# Patient Record
Sex: Male | Born: 1935
Health system: Southern US, Community
[De-identification: ages and names within clinical notes are randomized; demographics above are authoritative.]

## PROBLEM LIST (undated history)

## (undated) DIAGNOSIS — C4490 Unspecified malignant neoplasm of skin, unspecified: Secondary | ICD-10-CM

## (undated) DIAGNOSIS — M1711 Unilateral primary osteoarthritis, right knee: Secondary | ICD-10-CM

## (undated) DIAGNOSIS — C679 Malignant neoplasm of bladder, unspecified: Secondary | ICD-10-CM

## (undated) DIAGNOSIS — Z8546 Personal history of malignant neoplasm of prostate: Secondary | ICD-10-CM

## (undated) DIAGNOSIS — I2510 Atherosclerotic heart disease of native coronary artery without angina pectoris: Secondary | ICD-10-CM

## (undated) DIAGNOSIS — M12812 Other specific arthropathies, not elsewhere classified, left shoulder: Secondary | ICD-10-CM

## (undated) DIAGNOSIS — M75102 Unspecified rotator cuff tear or rupture of left shoulder, not specified as traumatic: Secondary | ICD-10-CM

## (undated) DIAGNOSIS — Z8619 Personal history of other infectious and parasitic diseases: Secondary | ICD-10-CM

## (undated) DIAGNOSIS — E785 Hyperlipidemia, unspecified: Secondary | ICD-10-CM

## (undated) DIAGNOSIS — A0472 Enterocolitis due to Clostridium difficile, not specified as recurrent: Secondary | ICD-10-CM

## (undated) DIAGNOSIS — J181 Lobar pneumonia, unspecified organism: Secondary | ICD-10-CM

## (undated) DIAGNOSIS — I10 Essential (primary) hypertension: Secondary | ICD-10-CM

## (undated) HISTORY — DX: Atherosclerotic heart disease of native coronary artery without angina pectoris: I25.10

## (undated) HISTORY — DX: Other specific arthropathies, not elsewhere classified, left shoulder: M12.812

## (undated) HISTORY — PX: OTHER SURGICAL HISTORY: SHX169

## (undated) HISTORY — DX: Enterocolitis due to Clostridium difficile, not specified as recurrent: A04.72

## (undated) HISTORY — DX: Lobar pneumonia, unspecified organism: J18.1

## (undated) HISTORY — DX: Unilateral primary osteoarthritis, right knee: M17.11

## (undated) HISTORY — DX: Personal history of malignant neoplasm of prostate: Z85.46

## (undated) HISTORY — DX: Personal history of other infectious and parasitic diseases: Z86.19

## (undated) HISTORY — DX: Malignant neoplasm of bladder, unspecified: C67.9

## (undated) HISTORY — DX: Unspecified malignant neoplasm of skin, unspecified: C44.90

## (undated) HISTORY — PX: NASAL SEPTUM SURGERY: SHX37

## (undated) HISTORY — DX: Essential (primary) hypertension: I10

## (undated) HISTORY — DX: Hyperlipidemia, unspecified: E78.5

## (undated) HISTORY — DX: Unspecified rotator cuff tear or rupture of left shoulder, not specified as traumatic: M75.102

---
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
## 1990-01-28 HISTORY — PX: PROSTATECTOMY: SHX69

## 1994-01-28 HISTORY — PX: UVULOPALATOPHARYNGOPLASTY: SHX827

## 1995-01-29 HISTORY — PX: OTHER SURGICAL HISTORY: SHX169

## 1996-01-29 HISTORY — PX: SHOULDER ARTHROSCOPY WITH SUBACROMIAL DECOMPRESSION, ROTATOR CUFF REPAIR AND BICEP TENDON REPAIR: SHX5687

## 1997-01-28 HISTORY — PX: OTHER SURGICAL HISTORY: SHX169

## 2002-01-28 HISTORY — PX: OTHER SURGICAL HISTORY: SHX169

## 2002-01-28 HISTORY — PX: RADICAL ORCHIECTOMY: SHX2285

## 2004-01-29 HISTORY — PX: OTHER SURGICAL HISTORY: SHX169

## 2006-01-28 HISTORY — PX: LUMBAR LAMINECTOMY/ DECOMPRESSION WITH MET-RX: SHX5959

## 2007-01-29 HISTORY — PX: HERNIA REPAIR: SHX51

## 2010-01-12 HISTORY — PX: MAXIMUM ACCESS (MAS)POSTERIOR LUMBAR INTERBODY FUSION (PLIF) 1 LEVEL: SHX6368

## 2011-06-13 HISTORY — PX: CATARACT EXTRACTION: SUR2

## 2011-08-15 HISTORY — PX: CATARACT EXTRACTION: SUR2

## 2012-05-03 HISTORY — PX: APPENDECTOMY: SHX54

## 2013-01-29 ENCOUNTER — Encounter: Payer: Self-pay | Admitting: Sports Medicine

## 2013-01-29 ENCOUNTER — Ambulatory Visit (INDEPENDENT_AMBULATORY_CARE_PROVIDER_SITE_OTHER): Payer: Medicare HMO | Admitting: Sports Medicine

## 2013-01-29 VITALS — BP 132/74 | HR 67 | Wt 162.0 lb

## 2013-01-29 DIAGNOSIS — I10 Essential (primary) hypertension: Secondary | ICD-10-CM | POA: Insufficient documentation

## 2013-01-29 DIAGNOSIS — I2510 Atherosclerotic heart disease of native coronary artery without angina pectoris: Secondary | ICD-10-CM

## 2013-01-29 DIAGNOSIS — Z8546 Personal history of malignant neoplasm of prostate: Secondary | ICD-10-CM

## 2013-01-29 DIAGNOSIS — E785 Hyperlipidemia, unspecified: Secondary | ICD-10-CM

## 2013-01-29 DIAGNOSIS — I890 Lymphedema, not elsewhere classified: Secondary | ICD-10-CM | POA: Insufficient documentation

## 2013-01-29 DIAGNOSIS — Z0000 Encounter for general adult medical examination without abnormal findings: Secondary | ICD-10-CM | POA: Insufficient documentation

## 2013-01-29 DIAGNOSIS — Z299 Encounter for prophylactic measures, unspecified: Secondary | ICD-10-CM

## 2013-01-29 HISTORY — DX: Essential (primary) hypertension: I10

## 2013-01-29 HISTORY — DX: Atherosclerotic heart disease of native coronary artery without angina pectoris: I25.10

## 2013-01-29 HISTORY — DX: Personal history of malignant neoplasm of prostate: Z85.46

## 2013-01-29 HISTORY — DX: Hyperlipidemia, unspecified: E78.5

## 2013-01-29 NOTE — Assessment & Plan Note (Signed)
Stable on simvastatin, not due yet for a recheck.

## 2013-01-29 NOTE — Progress Notes (Signed)
  Subjective:    CC: Establish care.   HPI:  Coronary disease: Stable on current medications, currently sees cardiologist, no chest pain.  Lymphedema: Present in the right leg after radical prostatectomy, he wears a compression stocking and is happy with results so for.  Hyperlipidemia: Stable on statin.  Hypertension: Stable on current medications.  Past medical history, Surgical history, Family history not pertinant except as noted below, Social history, Allergies, and medications have been entered into the medical record, reviewed, and no changes needed.   Review of Systems: No headache, visual changes, nausea, vomiting, diarrhea, constipation, dizziness, abdominal pain, skin rash, fevers, chills, night sweats, swollen lymph nodes, weight loss, chest pain, body aches, joint swelling, muscle aches, shortness of breath, mood changes, visual or auditory hallucinations.  Objective:    General: Well Developed, well nourished, and in no acute distress.  Neuro: Alert and oriented x3, extra-ocular muscles intact, sensation grossly intact.  HEENT: Normocephalic, atraumatic, pupils equal round reactive to light, neck supple, no masses, no lymphadenopathy, thyroid nonpalpable.  Skin: Warm and dry, no rashes noted.  Cardiac: Regular rate and rhythm, no murmurs rubs or gallops. There is 1+ pitting edema in the right lower extremity with a negative Homans sign. Respiratory: Clear to auscultation bilaterally. Not using accessory muscles, speaking in full sentences.  Abdominal: Soft, nontender, nondistended, positive bowel sounds, no masses, no organomegaly.  Musculoskeletal: Shoulder, elbow, wrist, hip, knee, ankle stable, and with full range of motion.  Impression and Recommendations:    The patient was counselled, risk factors were discussed, anticipatory guidance given.

## 2013-01-29 NOTE — Assessment & Plan Note (Signed)
Patient will return for Medicare physical.

## 2013-01-29 NOTE — Assessment & Plan Note (Signed)
Well controlled, no changes 

## 2013-01-29 NOTE — Assessment & Plan Note (Signed)
Stable, well controlled on Plavix. No heart failure per history. He does have a cardiologist.

## 2013-01-29 NOTE — Assessment & Plan Note (Signed)
Stable with compression stocking.

## 2013-02-09 ENCOUNTER — Other Ambulatory Visit: Payer: Self-pay | Admitting: Sports Medicine

## 2013-02-09 ENCOUNTER — Ambulatory Visit (INDEPENDENT_AMBULATORY_CARE_PROVIDER_SITE_OTHER): Payer: Medicare HMO

## 2013-02-09 ENCOUNTER — Ambulatory Visit (INDEPENDENT_AMBULATORY_CARE_PROVIDER_SITE_OTHER): Payer: Commercial Managed Care - HMO | Admitting: Sports Medicine

## 2013-02-09 ENCOUNTER — Ambulatory Visit (HOSPITAL_BASED_OUTPATIENT_CLINIC_OR_DEPARTMENT_OTHER): Admission: RE | Admit: 2013-02-09 | Payer: Medicare HMO | Source: Ambulatory Visit

## 2013-02-09 ENCOUNTER — Encounter: Payer: Self-pay | Admitting: Sports Medicine

## 2013-02-09 VITALS — BP 139/76 | HR 70 | Wt 161.0 lb

## 2013-02-09 DIAGNOSIS — M150 Primary generalized (osteo)arthritis: Secondary | ICD-10-CM

## 2013-02-09 DIAGNOSIS — Z299 Encounter for prophylactic measures, unspecified: Secondary | ICD-10-CM

## 2013-02-09 DIAGNOSIS — M1711 Unilateral primary osteoarthritis, right knee: Secondary | ICD-10-CM

## 2013-02-09 DIAGNOSIS — I10 Essential (primary) hypertension: Secondary | ICD-10-CM

## 2013-02-09 DIAGNOSIS — M159 Polyosteoarthritis, unspecified: Secondary | ICD-10-CM

## 2013-02-09 DIAGNOSIS — I2510 Atherosclerotic heart disease of native coronary artery without angina pectoris: Secondary | ICD-10-CM

## 2013-02-09 DIAGNOSIS — Z136 Encounter for screening for cardiovascular disorders: Secondary | ICD-10-CM

## 2013-02-09 HISTORY — DX: Unilateral primary osteoarthritis, right knee: M17.11

## 2013-02-09 MED ORDER — SODIUM CHLORIDE 1 G PO TABS
1.0000 g | ORAL_TABLET | Freq: Three times a day (TID) | ORAL | Status: DC
Start: 2013-02-09 — End: 2013-02-09

## 2013-02-09 MED ORDER — SODIUM CHLORIDE 1 G PO TABS: 1.0000 g | ORAL_TABLET | Freq: Three times a day (TID) | ORAL | Status: DC

## 2013-02-09 NOTE — Assessment & Plan Note (Addendum)
Twelve-lead ECG, screening. He will continue followup with his cardiologist. History of smoking, we will get an aortic ultrasound screening.

## 2013-02-09 NOTE — Assessment & Plan Note (Signed)
Pain is predominantly the right radial carpal joint. X-rays. He will return sometime later this week for a radiocarpal joint injection.

## 2013-02-09 NOTE — Assessment & Plan Note (Signed)
Medicare physical performed today. Return in one year for his next one.

## 2013-02-09 NOTE — Progress Notes (Signed)
Subjective:    Jeffery Hart is a 80 y.o. male who presents for Medicare Annual/Subsequent preventive examination.   He also has significant right wrist pain, and in fact osteoarthritis at multiple sites. He currently takes Tylenol, but this provided only minimal relief of his pain. Predominant pain is over the dorsal radiocarpal joint. Moderate, persistent.  Preventive Screening-Counseling & Management  Tobacco History  Smoking status  . Former Smoker  Smokeless tobacco  . Not on file    Problems Prior to Visit 1.   Current Problems (verified) Patient Active Problem List   Diagnosis Date Noted  . Essential hypertension, benign 01/29/2013  . CAD (coronary artery disease) status post bypass grafting 01/29/2013  . Preventive measure 01/29/2013  . History of prostate cancer 01/29/2013  . Lymphedema of right leg 01/29/2013  . Hyperlipidemia 01/29/2013    Medications Prior to Visit Current Outpatient Prescriptions on File Prior to Visit  Medication Sig Dispense Refill  . acyclovir (ZOVIRAX) 800 MG tablet Take 800 mg by mouth every 6 (six) hours as needed.      . clopidogrel (PLAVIX) 75 MG tablet Take 75 mg by mouth daily with breakfast.      . cyclobenzaprine (FLEXERIL) 10 MG tablet Take 10 mg by mouth every 8 (eight) hours as needed for muscle spasms.      Marland Kitchen doxepin (SINEQUAN) 25 MG capsule Take 25 mg by mouth.      . ferrous sulfate 325 (65 FE) MG tablet Take 325 mg by mouth daily with breakfast.      . fluorouracil (EFUDEX) 5 % cream Apply topically 2 (two) times daily.      . furosemide (LASIX) 80 MG tablet Take 80 mg by mouth.      . gabapentin (NEURONTIN) 300 MG capsule Take 300 mg by mouth 3 (three) times daily.      Marland Kitchen HYDROcodone-acetaminophen (VICODIN) 5-500 MG per tablet Take 1 tablet by mouth every 6 (six) hours as needed for pain.      Marland Kitchen lisinopril (PRINIVIL,ZESTRIL) 10 MG tablet Take 10 mg by mouth daily.      . nitroGLYCERIN (NITROSTAT) 0.4 MG SL tablet Place  0.4 mg under the tongue as needed for chest pain.      Marland Kitchen propranolol (INDERAL) 20 MG tablet Take 30 mg by mouth 2 (two) times daily.      . simvastatin (ZOCOR) 40 MG tablet Take 40 mg by mouth daily.      . sodium chloride 1 G tablet Take 1 g by mouth 3 (three) times daily.       No current facility-administered medications on file prior to visit.    Current Medications (verified) Current Outpatient Prescriptions  Medication Sig Dispense Refill  . acyclovir (ZOVIRAX) 800 MG tablet Take 800 mg by mouth every 6 (six) hours as needed.      . clopidogrel (PLAVIX) 75 MG tablet Take 75 mg by mouth daily with breakfast.      . cyclobenzaprine (FLEXERIL) 10 MG tablet Take 10 mg by mouth every 8 (eight) hours as needed for muscle spasms.      Marland Kitchen doxepin (SINEQUAN) 25 MG capsule Take 25 mg by mouth.      . ferrous sulfate 325 (65 FE) MG tablet Take 325 mg by mouth daily with breakfast.      . fluorouracil (EFUDEX) 5 % cream Apply topically 2 (two) times daily.      . furosemide (LASIX) 80 MG tablet Take 80 mg by mouth.      Marland Kitchen  gabapentin (NEURONTIN) 300 MG capsule Take 300 mg by mouth 3 (three) times daily.      Marland Kitchen HYDROcodone-acetaminophen (VICODIN) 5-500 MG per tablet Take 1 tablet by mouth every 6 (six) hours as needed for pain.      Marland Kitchen lisinopril (PRINIVIL,ZESTRIL) 10 MG tablet Take 10 mg by mouth daily.      . nitroGLYCERIN (NITROSTAT) 0.4 MG SL tablet Place 0.4 mg under the tongue as needed for chest pain.      Marland Kitchen propranolol (INDERAL) 20 MG tablet Take 30 mg by mouth 2 (two) times daily.      . simvastatin (ZOCOR) 40 MG tablet Take 40 mg by mouth daily.      . sodium chloride 1 G tablet Take 1 g by mouth 3 (three) times daily.       No current facility-administered medications for this visit.     Allergies (verified) Review of patient's allergies indicates no known allergies.   PAST HISTORY  Family History History reviewed. No pertinent family history.  Social History History   Substance Use Topics  . Smoking status: Former Research scientist (life sciences)  . Smokeless tobacco: Not on file  . Alcohol Use: Not on file    Are there smokers in your home (other than you)?  No  Risk Factors Current exercise habits: The patient does not participate in regular exercise at present.  Dietary issues discussed: No   Cardiac risk factors: advanced age (older than 68 for men, 67 for women), dyslipidemia, hypertension and sedentary lifestyle.  Depression Screen (Note: if answer to either of the following is "Yes", a more complete depression screening is indicated)   Q1: Over the past two weeks, have you felt down, depressed or hopeless? No  Q2: Over the past two weeks, have you felt little interest or pleasure in doing things? No  Have you lost interest or pleasure in daily life? No  Do you often feel hopeless? No  Do you cry easily over simple problems? No  Activities of Daily Living In your present state of health, do you have any difficulty performing the following activities?:  Driving? No Managing money?  No Feeding yourself? No Getting from bed to chair? No Climbing a flight of stairs? No Preparing food and eating?: No Bathing or showering? No Getting dressed: No Getting to the toilet? No Using the toilet:No Moving around from place to place: No In the past year have you fallen or had a near fall?:No   Are you sexually active?  No  Do you have more than one partner?  No  Hearing Difficulties: Yes Do you often ask people to speak up or repeat themselves? Yes Do you experience ringing or noises in your ears? Yes Do you have difficulty understanding soft or whispered voices? Yes   Do you feel that you have a problem with memory? No  Do you often misplace items? No  Do you feel safe at home?  Yes  Cognitive Testing  Alert? Yes  Normal Appearance?Yes  Oriented to person? Yes  Place? Yes   Time? Yes  Recall of three objects?  Yes  Can perform simple calculations?  Yes  Displays appropriate judgment?Yes  Can read the correct time from a watch face?Yes   Advanced Directives have been discussed with the patient? No   List the Names of Other Physician/Practitioners you currently use: 1.    Indicate any recent Medical Services you may have received from other than [facility] providers in the past year (date may  be approximate).   There is no immunization history on file for this patient.  Screening Tests Health Maintenance  Topic Date Due  . Tetanus/tdap  03/12/1946  . Colonoscopy  03/13/1979  . Zostavax  03/12/1989  . Pneumococcal Polysaccharide Vaccine Age 72 And Over  03/12/1994  . Influenza Vaccine  08/28/2012    All answers were reviewed with the patient and necessary referrals were made:  Aundria Mems, MD   02/09/2013   History reviewed: allergies, current medications, past family history, past medical history, past social history, past surgical history and problem list  Review of Systems A comprehensive review of systems was negative.    Objective:     Vision by Snellen chart: right eye:20/20, left eye:20/20 Blood pressure 139/76, pulse 70, weight 161 lb (73.029 kg). There is no height on file to calculate BMI.  General Appearance: Alert, well developed and well nourished, cooperative, no distress.  Head: Normocephalic, no obvious abnormality  Eyes: PERRL, EOM's intact, conjunctiva and corneas clear.  Nose: Nares symmetrical, septum midline, mucosa pink, clear watery discharge; no sinus tenderness  Throat: Lips, tongue, and mucosa are moist, pink, and intact; teeth intact  Neck: Supple, symmetrical, trachea midline, no adenopathy; thyroid: no enlargement, symmetric,no tenderness/mass/nodules; no carotid bruit, no JVD  Back: Symmetrical, no curvature, ROM normal, no CVA tenderness  Chest/Breast: No mass or tenderness  Lungs: Clear to auscultation bilaterally, respirations unlabored  Heart: Normal PMI, no lower extremity  edema, regular rate & rhythm, S1 and S2 normal, no murmurs, rubs, or gallops. Abdomen: Soft, non-tender, bowel sounds active all four quadrants, no mass, or organomegaly  Musculoskeletal: All joints, extremities examined, non-tender and unremarkable.  Lymphatic: No adenopathy  Skin/Hair/Nails: Skin warm, dry, and intact, no rashes or abnormal dyspigmentation  Neurologic: Alert and oriented x3, no cranial nerve deficits, normal strength and tone, gait steady      Assessment:     Healthy male.     Plan:     During the course of the visit the patient was educated and counseled about appropriate screening and preventive services including:    Screening electrocardiogram  Diet review for nutrition referral? Yes ____  Not Indicated __x__   Patient Instructions (the written plan) was given to the patient.  Medicare Attestation I have personally reviewed: The patient's medical and social history Their use of alcohol, tobacco or illicit drugs Their current medications and supplements The patient's functional ability including ADLs,fall risks, home safety risks, cognitive, and hearing and visual impairment Diet and physical activities Evidence for depression or mood disorders  The patient's weight, height, BMI, and visual acuity have been recorded in the chart.  I have made referrals, counseling, and provided education to the patient based on review of the above and I have provided the patient with a written personalized care plan for preventive services.     Aundria Mems, MD   02/09/2013

## 2013-02-09 NOTE — Assessment & Plan Note (Signed)
Well controlled, no changes 

## 2013-02-11 ENCOUNTER — Telehealth: Payer: Self-pay

## 2013-02-11 ENCOUNTER — Encounter: Payer: Self-pay | Admitting: *Deleted

## 2013-02-11 DIAGNOSIS — Z299 Encounter for prophylactic measures, unspecified: Secondary | ICD-10-CM

## 2013-02-11 NOTE — Telephone Encounter (Signed)
Patient was in office recently for CPE  He state that he was fasting and forgot to request an order for lab work. He is requesting an order o have his iron and sodium checked along with all the other general lab work and he will come in fasting Tuesday morning to have those done. Merion Grimaldo,CMA

## 2013-02-11 NOTE — Telephone Encounter (Signed)
Order has been faxed to downstairs to lab. Rhonda Cunningham,CMA

## 2013-02-12 ENCOUNTER — Encounter: Payer: Self-pay | Admitting: Sports Medicine

## 2013-02-12 ENCOUNTER — Ambulatory Visit (INDEPENDENT_AMBULATORY_CARE_PROVIDER_SITE_OTHER): Payer: Medicare HMO | Admitting: Sports Medicine

## 2013-02-12 VITALS — BP 133/69 | HR 72 | Wt 161.0 lb

## 2013-02-12 DIAGNOSIS — M150 Primary generalized (osteo)arthritis: Secondary | ICD-10-CM

## 2013-02-12 DIAGNOSIS — M19039 Primary osteoarthritis, unspecified wrist: Secondary | ICD-10-CM

## 2013-02-12 NOTE — Progress Notes (Signed)
  Subjective:    CC: Followup  HPI: Right wrist osteoarthritis:  Pain is predominately localized over the radial carpal joint dorsally, worse with any movement, moderate, persistent without radiation. Oral analgesics are ineffective.  Past medical history, Surgical history, Family history not pertinant except as noted below, Social history, Allergies, and medications have been entered into the medical record, reviewed, and no changes needed.   Review of Systems: No fevers, chills, night sweats, weight loss, chest pain, or shortness of breath.   Objective:    General: Well Developed, well nourished, and in no acute distress.  Neuro: Alert and oriented x3, extra-ocular muscles intact, sensation grossly intact.  HEENT: Normocephalic, atraumatic, pupils equal round reactive to light, neck supple, no masses, no lymphadenopathy, thyroid nonpalpable.  Skin: Warm and dry, no rashes. Cardiac: Regular rate and rhythm, no murmurs rubs or gallops, no lower extremity edema.  Respiratory: Clear to auscultation bilaterally. Not using accessory muscles, speaking in full sentences.  Procedure: Real-time Ultrasound Guided Injection of right radial carpal joint Device: GE Logiq E  Verbal informed consent obtained.  Time-out conducted.  Noted no overlying erythema, induration, or other signs of local infection.  Skin prepped in a sterile fashion.  Local anesthesia: Topical Ethyl chloride.  With sterile technique and under real time ultrasound guidance:  25-gauge needle advanced over the top of the lunate, into the radial carpal joint. A total of 1 cc Kenalog 40, 2 cc lidocaine injected easily. Completed without difficulty  Pain immediately resolved suggesting accurate placement of the medication.  Advised to call if fevers/chills, erythema, induration, drainage, or persistent bleeding.  Images permanently stored and available for review in the ultrasound unit.  Impression: Technically successful  ultrasound guided injection.  Impression and Recommendations:

## 2013-02-12 NOTE — Assessment & Plan Note (Signed)
Ultrasound-guided radiocarpal joint injection as above. Return in one month.

## 2013-02-16 ENCOUNTER — Ambulatory Visit (INDEPENDENT_AMBULATORY_CARE_PROVIDER_SITE_OTHER): Payer: Medicare HMO

## 2013-02-16 DIAGNOSIS — I25810 Atherosclerosis of coronary artery bypass graft(s) without angina pectoris: Secondary | ICD-10-CM

## 2013-02-16 DIAGNOSIS — Z87891 Personal history of nicotine dependence: Secondary | ICD-10-CM

## 2013-02-16 LAB — COMPREHENSIVE METABOLIC PANEL
AST: 16 U/L (ref 0–37)
Albumin: 4.1 g/dL (ref 3.5–5.2)
BUN: 21 mg/dL (ref 6–23)
CO2: 28 mEq/L (ref 19–32)
Calcium: 9 mg/dL (ref 8.4–10.5)
Chloride: 103 mEq/L (ref 96–112)
Creat: 0.97 mg/dL (ref 0.50–1.35)
Glucose, Bld: 88 mg/dL (ref 70–99)
Potassium: 4.3 mEq/L (ref 3.5–5.3)

## 2013-02-16 LAB — CBC
HCT: 34.6 % — ABNORMAL LOW (ref 39.0–52.0)
Hemoglobin: 11.5 g/dL — ABNORMAL LOW (ref 13.0–17.0)
MCH: 29.6 pg (ref 26.0–34.0)
MCHC: 33.2 g/dL (ref 30.0–36.0)
MCV: 89.2 fL (ref 78.0–100.0)
Platelets: 271 K/uL (ref 150–400)
RBC: 3.88 MIL/uL — ABNORMAL LOW (ref 4.22–5.81)
RDW: 14.4 % (ref 11.5–15.5)
WBC: 7.5 10*3/uL (ref 4.0–10.5)

## 2013-02-16 LAB — COMPREHENSIVE METABOLIC PANEL WITH GFR
ALT: 19 U/L (ref 0–53)
Alkaline Phosphatase: 62 U/L (ref 39–117)
Sodium: 138 meq/L (ref 135–145)
Total Bilirubin: 0.6 mg/dL (ref 0.3–1.2)
Total Protein: 6 g/dL (ref 6.0–8.3)

## 2013-02-16 LAB — LIPID PANEL
Cholesterol: 150 mg/dL (ref 0–200)
HDL: 42 mg/dL (ref >39)
LDL Cholesterol: 76 mg/dL (ref 0–99)
Total CHOL/HDL Ratio: 3.6 Ratio
Triglycerides: 159 mg/dL — ABNORMAL HIGH (ref <150)
VLDL: 32 mg/dL (ref 0–40)

## 2013-02-16 LAB — HEMOGLOBIN A1C
Hgb A1c MFr Bld: 5.6 % (ref <5.7)
Mean Plasma Glucose: 114 mg/dL (ref <117)

## 2013-02-16 LAB — IRON AND TIBC
%SAT: 46 % (ref 20–55)
Iron: 126 ug/dL (ref 42–165)
TIBC: 276 ug/dL (ref 215–435)
UIBC: 150 ug/dL (ref 125–400)

## 2013-02-17 LAB — FOLATE: Folate: 16.8 ng/mL

## 2013-02-17 LAB — FERRITIN: Ferritin: 560 ng/mL — ABNORMAL HIGH (ref 22–322)

## 2013-02-17 LAB — VITAMIN B12: Vitamin B-12: 679 pg/mL (ref 211–911)

## 2013-02-17 LAB — TSH: TSH: 1.653 u[IU]/mL (ref 0.350–4.500)

## 2013-02-26 ENCOUNTER — Telehealth: Payer: Self-pay

## 2013-02-26 NOTE — Telephone Encounter (Signed)
Updated medication changes. Jeffery Hart,CMA

## 2013-03-02 ENCOUNTER — Other Ambulatory Visit: Payer: Self-pay | Admitting: Sports Medicine

## 2013-03-02 NOTE — Telephone Encounter (Signed)
Is he still taking this? Doesn't look like we have filled this before. Clemetine Marker, LPN

## 2013-03-12 ENCOUNTER — Ambulatory Visit (INDEPENDENT_AMBULATORY_CARE_PROVIDER_SITE_OTHER): Payer: Medicare HMO | Admitting: Sports Medicine

## 2013-03-12 ENCOUNTER — Encounter: Payer: Self-pay | Admitting: Sports Medicine

## 2013-03-12 VITALS — BP 138/72 | HR 69 | Ht 66.0 in | Wt 161.0 lb

## 2013-03-12 DIAGNOSIS — M150 Primary generalized (osteo)arthritis: Secondary | ICD-10-CM

## 2013-03-12 DIAGNOSIS — M159 Polyosteoarthritis, unspecified: Secondary | ICD-10-CM

## 2013-03-12 NOTE — Patient Instructions (Signed)
Please talk to our office manager Lacretia Nicks regarding your son continuing to come here. Phone number [phone].

## 2013-03-12 NOTE — Assessment & Plan Note (Signed)
Radiocarpal joint injection into the right wrist at the last visit resulted in temporary but excellent relief for 2 weeks. He does desire to try repeat injection, radiocarpal joint was injected again this time with Depo-Medrol instead of Kenalog. Return in one month.

## 2013-03-12 NOTE — Progress Notes (Signed)
  Subjective:    CC: Followup.  HPI: Wrist osteoarthritis:  4 weeks post right radiocarpal injection, 2 week response, then pain returned.  Its overall a little better than before the injection.  He does desire repeat injection. Pain is mild, persistent. Localized at the radiocarpal joint without radiation.  Past medical history, Surgical history, Family history not pertinant except as noted below, Social history, Allergies, and medications have been entered into the medical record, reviewed, and no changes needed.   Review of Systems: No fevers, chills, night sweats, weight loss, chest pain, or shortness of breath.   Objective:    General: Well Developed, well nourished, and in no acute distress.  Neuro: Alert and oriented x3, extra-ocular muscles intact, sensation grossly intact.  HEENT: Normocephalic, atraumatic, pupils equal round reactive to light, neck supple, no masses, no lymphadenopathy, thyroid nonpalpable.  Skin: Warm and dry, no rashes. Cardiac: Regular rate and rhythm, no murmurs rubs or gallops, no lower extremity edema.  Respiratory: Clear to auscultation bilaterally. Not using accessory muscles, speaking in full sentences. Right Wrist: Inspection normal with no visible erythema or swelling. ROM smooth and normal with good flexion and extension and ulnar/radial deviation that is symmetrical with opposite wrist. Only minimal tenderness to palpation over the radiocarpal joint. No snuffbox tenderness. No tenderness over Canal of [place]. Strength 5/5 in all directions without pain. Negative Finkelstein, tinel's and phalens. Negative Watson's test.  Procedure: Real-time Ultrasound Guided Injection of right radiocarpal joint  Device: GE Logiq E  Verbal informed consent obtained.  Time-out conducted.  Noted no overlying erythema, induration, or other signs of local infection.  Skin prepped in a sterile fashion.  Local anesthesia: Topical Ethyl chloride.  With sterile  technique and under real time ultrasound guidance:  1 cc Depo-Medrol 40, 1 cc lidocaine, 1 cc Marcaine injected easily into the radiocarpal joint.  Completed without difficulty  Pain immediately resolved suggesting accurate placement of the medication.  Advised to call if fevers/chills, erythema, induration, drainage, or persistent bleeding.  Images permanently stored and available for review in the ultrasound unit.  Impression: Technically successful ultrasound guided injection.  Impression and Recommendations:

## 2013-04-09 ENCOUNTER — Encounter: Payer: Self-pay | Admitting: Sports Medicine

## 2013-04-09 ENCOUNTER — Ambulatory Visit (INDEPENDENT_AMBULATORY_CARE_PROVIDER_SITE_OTHER): Payer: Medicare HMO | Admitting: Sports Medicine

## 2013-04-09 VITALS — BP 130/69 | HR 65 | Ht 65.0 in | Wt 161.0 lb

## 2013-04-09 DIAGNOSIS — M150 Primary generalized (osteo)arthritis: Secondary | ICD-10-CM

## 2013-04-09 DIAGNOSIS — M25519 Pain in unspecified shoulder: Secondary | ICD-10-CM

## 2013-04-09 DIAGNOSIS — M75102 Unspecified rotator cuff tear or rupture of left shoulder, not specified as traumatic: Secondary | ICD-10-CM

## 2013-04-09 DIAGNOSIS — M25512 Pain in left shoulder: Secondary | ICD-10-CM

## 2013-04-09 DIAGNOSIS — M12812 Other specific arthropathies, not elsewhere classified, left shoulder: Secondary | ICD-10-CM

## 2013-04-09 DIAGNOSIS — M159 Polyosteoarthritis, unspecified: Secondary | ICD-10-CM

## 2013-04-09 HISTORY — DX: Other specific arthropathies, not elsewhere classified, left shoulder: M12.812

## 2013-04-09 HISTORY — DX: Unspecified rotator cuff tear or rupture of left shoulder, not specified as traumatic: M75.102

## 2013-04-09 MED ORDER — HYDROCODONE-ACETAMINOPHEN 5-325 MG PO TABS: 1.0000 | ORAL_TABLET | Freq: Three times a day (TID) | ORAL | Status: DC | PRN

## 2013-04-09 NOTE — Progress Notes (Signed)
  Subjective:    CC: Followup  HPI: Left shoulder pain: History of rotator cuff tear, pain is worse over the deltoid, worse with overhead activities, tends to wake him from sleep.  Right wrist osteoarthritis : has now had 2 injections, the most recent of which was with Depo-Medrol, or persistent pain, tends to worsen when caring for his son.  Past medical history, Surgical history, Family history not pertinant except as noted below, Social history, Allergies, and medications have been entered into the medical record, reviewed, and no changes needed.   Review of Systems: No fevers, chills, night sweats, weight loss, chest pain, or shortness of breath.   Objective:    General: Well Developed, well nourished, and in no acute distress.  Neuro: Alert and oriented x3, extra-ocular muscles intact, sensation grossly intact.  HEENT: Normocephalic, atraumatic, pupils equal round reactive to light, neck supple, no masses, no lymphadenopathy, thyroid nonpalpable.  Skin: Warm and dry, no rashes. Cardiac: Regular rate and rhythm, no murmurs rubs or gallops, no lower extremity edema.  Respiratory: Clear to auscultation bilaterally. Not using accessory muscles, speaking in full sentences.  Procedure: Real-time Ultrasound Guided Injection of left subacromial bursa Device: GE Logiq E  Verbal informed consent obtained.  Time-out conducted.  Noted no overlying erythema, induration, or other signs of local infection.  Skin prepped in a sterile fashion.  Local anesthesia: Topical Ethyl chloride.  With sterile technique and under real time ultrasound guidance:  1cc kenalog 40, 4 cc lidocaine injection easily. Completed without difficulty  Pain immediately resolved suggesting accurate placement of the medication.  Advised to call if fevers/chills, erythema, induration, drainage, or persistent bleeding.  Images permanently stored and available for review in the ultrasound unit.  Impression: Technically  successful ultrasound guided injection.  Impression and Recommendations:

## 2013-04-09 NOTE — Assessment & Plan Note (Signed)
Subacromial injection as above. Return as needed.

## 2013-04-09 NOTE — Assessment & Plan Note (Signed)
Persistent pain despite 2 radiocarpal joint injections on the right wrist. Options now include wrist fusion versus Velcro brace. We are going to proceed with a Velcro wrist brace, he will continue Tylenol, and I am going to add occasional hydrocodone.

## 2013-04-14 ENCOUNTER — Other Ambulatory Visit: Payer: Self-pay | Admitting: Sports Medicine

## 2013-04-19 ENCOUNTER — Other Ambulatory Visit: Payer: Self-pay | Admitting: Sports Medicine

## 2013-05-03 ENCOUNTER — Telehealth: Payer: Self-pay

## 2013-05-03 DIAGNOSIS — G4733 Obstructive sleep apnea (adult) (pediatric): Secondary | ICD-10-CM

## 2013-05-03 NOTE — Telephone Encounter (Signed)
Patient called stated that he has a CPAP done in the past  But he needs a referral to [facility] [phone] fax)([place]  [phone] fax for his insurance to cover. Marland Kitchen

## 2013-05-04 NOTE — Telephone Encounter (Signed)
Referral placed.

## 2013-05-05 ENCOUNTER — Telehealth: Payer: Self-pay | Admitting: Sports Medicine

## 2013-05-05 NOTE — Telephone Encounter (Signed)
Please have him send [place] notes and have Suanne Marker let the family know this information.

## 2013-05-05 NOTE — Telephone Encounter (Signed)
I called [facility] for patient to be evaluated for sleep apnea per Referral placed. I was told by Ronalee Belts at [facility] that patient already had a sleep study done(can send  [place] notes)patient had a re-titrate study done on 09/29/12(can send notes).  Please advise.

## 2013-05-18 ENCOUNTER — Other Ambulatory Visit: Payer: Self-pay | Admitting: Sports Medicine

## 2013-05-18 DIAGNOSIS — C4490 Unspecified malignant neoplasm of skin, unspecified: Secondary | ICD-10-CM

## 2013-05-24 ENCOUNTER — Other Ambulatory Visit: Payer: Self-pay | Admitting: Sports Medicine

## 2013-05-25 NOTE — Telephone Encounter (Signed)
Spoke to patient spouse and advised them that Evaluation for sleep study was received from [place] sleep study and scanned in chart. Eyal Greenhaw,CMA

## 2013-05-26 ENCOUNTER — Encounter: Payer: Self-pay | Admitting: Sports Medicine

## 2013-05-27 ENCOUNTER — Telehealth: Payer: Self-pay

## 2013-05-27 MED ORDER — LISINOPRIL 10 MG PO TABS: 10.0000 mg | ORAL_TABLET | Freq: Every day | ORAL | Status: DC

## 2013-05-27 NOTE — Telephone Encounter (Signed)
Patient request  refill for Lisinopril 10 mg. #90 [id number] sent to [facility]. Rhonda Cunningham,CMA

## 2013-05-31 ENCOUNTER — Other Ambulatory Visit: Payer: Self-pay | Admitting: Sports Medicine

## 2013-07-03 ENCOUNTER — Other Ambulatory Visit: Payer: Self-pay | Admitting: Sports Medicine

## 2013-07-12 ENCOUNTER — Other Ambulatory Visit: Payer: Self-pay | Admitting: Sports Medicine

## 2013-07-12 ENCOUNTER — Ambulatory Visit (INDEPENDENT_AMBULATORY_CARE_PROVIDER_SITE_OTHER): Payer: Medicare HMO | Admitting: Sports Medicine

## 2013-07-12 ENCOUNTER — Encounter: Payer: Self-pay | Admitting: Sports Medicine

## 2013-07-12 VITALS — BP 142/79 | HR 68 | Ht 66.0 in | Wt 165.0 lb

## 2013-07-12 DIAGNOSIS — M159 Polyosteoarthritis, unspecified: Secondary | ICD-10-CM

## 2013-07-12 DIAGNOSIS — M25519 Pain in unspecified shoulder: Secondary | ICD-10-CM

## 2013-07-12 DIAGNOSIS — Z23 Encounter for immunization: Secondary | ICD-10-CM

## 2013-07-12 DIAGNOSIS — Z299 Encounter for prophylactic measures, unspecified: Secondary | ICD-10-CM

## 2013-07-12 DIAGNOSIS — M25512 Pain in left shoulder: Secondary | ICD-10-CM

## 2013-07-12 DIAGNOSIS — M150 Primary generalized (osteo)arthritis: Secondary | ICD-10-CM

## 2013-07-12 MED ORDER — NITROGLYCERIN 0.2 MG/HR TD PT24: MEDICATED_PATCH | TRANSDERMAL | Status: DC

## 2013-07-12 NOTE — Assessment & Plan Note (Signed)
Persistent at the right radiocarpal joint despite 2 injections. Interestingly he said a fantastic response to mobilization in a short arm Velcro brace. Continue bracing, return as needed, he has told us wrist fusion is not an option.

## 2013-07-12 NOTE — Assessment & Plan Note (Signed)
Tdap today

## 2013-07-12 NOTE — Progress Notes (Signed)
  Subjective:    CC: Followup  HPI: Left wrist osteoarthritis : did not respond to 2 intra-articular ultrasound guided injections, we placed him in a Velcro wrist brace and this has provided him with increased relief.  Left shoulder pain : Moderate, persistent, over the deltoid, has not responded to injection  Past medical history, Surgical history, Family history not pertinant except as noted below, Social history, Allergies, and medications have been entered into the medical record, reviewed, and no changes needed.   Review of Systems: No fevers, chills, night sweats, weight loss, chest pain, or shortness of breath.   Objective:    General: Well Developed, well nourished, and in no acute distress.  Neuro: Alert and oriented x3, extra-ocular muscles intact, sensation grossly intact.  HEENT: Normocephalic, atraumatic, pupils equal round reactive to light, neck supple, no masses, no lymphadenopathy, thyroid nonpalpable.  Skin: Warm and dry, no rashes. Cardiac: Regular rate and rhythm, no murmurs rubs or gallops, no lower extremity edema.  Respiratory: Clear to auscultation bilaterally. Not using accessory muscles, speaking in full sentences.  Impression and Recommendations:

## 2013-07-12 NOTE — Assessment & Plan Note (Signed)
Several week response to subacromial injection. At this point we are going to proceed with biologic therapy. Topical nitroglycerin patch, formal physical therapy. Return in 2 months. Again, last resort is operative subacromial decompression.

## 2013-07-13 ENCOUNTER — Ambulatory Visit: Payer: Medicare HMO | Admitting: Sports Medicine

## 2013-07-23 ENCOUNTER — Ambulatory Visit (INDEPENDENT_AMBULATORY_CARE_PROVIDER_SITE_OTHER): Payer: Medicare HMO | Admitting: Sports Medicine

## 2013-07-23 ENCOUNTER — Ambulatory Visit (INDEPENDENT_AMBULATORY_CARE_PROVIDER_SITE_OTHER): Payer: Medicare HMO

## 2013-07-23 ENCOUNTER — Encounter: Payer: Self-pay | Admitting: Sports Medicine

## 2013-07-23 VITALS — BP 130/74 | HR 77 | Ht 66.0 in | Wt 164.0 lb

## 2013-07-23 DIAGNOSIS — M159 Polyosteoarthritis, unspecified: Secondary | ICD-10-CM

## 2013-07-23 DIAGNOSIS — S42101A Fracture of unspecified part of scapula, right shoulder, initial encounter for closed fracture: Secondary | ICD-10-CM | POA: Insufficient documentation

## 2013-07-23 DIAGNOSIS — M150 Primary generalized (osteo)arthritis: Secondary | ICD-10-CM

## 2013-07-23 DIAGNOSIS — S298XXA Other specified injuries of thorax, initial encounter: Secondary | ICD-10-CM

## 2013-07-23 DIAGNOSIS — S299XXA Unspecified injury of thorax, initial encounter: Secondary | ICD-10-CM

## 2013-07-23 DIAGNOSIS — K8010 Calculus of gallbladder with chronic cholecystitis without obstruction: Secondary | ICD-10-CM

## 2013-07-23 MED ORDER — HYDROCODONE-ACETAMINOPHEN 5-325 MG PO TABS: 1.0000 | ORAL_TABLET | Freq: Three times a day (TID) | ORAL | Status: DC | PRN

## 2013-07-23 NOTE — Assessment & Plan Note (Addendum)
[place] off a ladder earlier. Now with significant pain and bruising at the posterior chest wall, on the ribs at the medial border of the scapula and over the scapula itself. Strap with a rib belt, hydrocodone for pain. Stat CT of the chest looking for fractures.  CT scan does show a fracture of the inferior pole of the right scapula, it is starting to corticated suggesting that this is slightly remote. I do suspect this likely represented a nonunion, and his recurrent injury will just need to be treated like a new acute scapular fracture.  I billed a fracture code for this visit, all subsequent visits for this complaint will be "post-op checks" in the global period.

## 2013-07-23 NOTE — Progress Notes (Signed)
  Subjective:    CC: Right shoulder injury  HPI: Recently Jeffery Hart fell backwards off a ladder, he impacted the posterior aspect of his right shoulder, and had immediate pain, and bruising. Pain is severe, persistent. Mild shortness of breath.  Past medical history, Surgical history, Family history not pertinant except as noted below, Social history, Allergies, and medications have been entered into the medical record, reviewed, and no changes needed.   Review of Systems: No fevers, chills, night sweats, weight loss, chest pain, or shortness of breath.   Objective:    General: Well Developed, well nourished, and in no acute distress.  Neuro: Alert and oriented x3, extra-ocular muscles intact, sensation grossly intact.  HEENT: Normocephalic, atraumatic, pupils equal round reactive to light, neck supple, no masses, no lymphadenopathy, thyroid nonpalpable.  Skin: Warm and dry, no rashes. Cardiac: Regular rate and rhythm, no murmurs rubs or gallops, no lower extremity edema.  Respiratory: Clear to auscultation bilaterally. Not using accessory muscles, speaking in full sentences. Right shoulder: Bruising posteriorly, with tenderness to palpation along the entire periscapular region, severe, persistent. Good motion of the shoulder itself.  CT scan was personally reviewed and shows what appears to be an old inferior pole scapular fracture on the right, but now appears to have created a pseudoarthrosis/nonunion.  Chest was strapped with compressive dressing.  Impression and Recommendations:

## 2013-07-27 ENCOUNTER — Telehealth: Payer: Self-pay | Admitting: *Deleted

## 2013-07-27 NOTE — Telephone Encounter (Signed)
CT thorax w/o that was completed on 07/23/13 was authorized [id number] via fax. Margette Fast, CMA

## 2013-08-06 ENCOUNTER — Encounter: Payer: Self-pay | Admitting: Sports Medicine

## 2013-08-06 ENCOUNTER — Ambulatory Visit: Payer: Medicare HMO | Admitting: Family Medicine

## 2013-08-06 ENCOUNTER — Ambulatory Visit (INDEPENDENT_AMBULATORY_CARE_PROVIDER_SITE_OTHER): Payer: Medicare HMO | Admitting: Sports Medicine

## 2013-08-06 VITALS — BP 136/70 | HR 61 | Wt 142.0 lb

## 2013-08-06 DIAGNOSIS — M25512 Pain in left shoulder: Secondary | ICD-10-CM

## 2013-08-06 DIAGNOSIS — M159 Polyosteoarthritis, unspecified: Secondary | ICD-10-CM

## 2013-08-06 DIAGNOSIS — M150 Primary generalized (osteo)arthritis: Secondary | ICD-10-CM

## 2013-08-06 DIAGNOSIS — S42101D Fracture of unspecified part of scapula, right shoulder, subsequent encounter for fracture with routine healing: Secondary | ICD-10-CM

## 2013-08-06 DIAGNOSIS — M25519 Pain in unspecified shoulder: Secondary | ICD-10-CM

## 2013-08-06 MED ORDER — HYDROCODONE-ACETAMINOPHEN 5-325 MG PO TABS: 1.0000 | ORAL_TABLET | Freq: Three times a day (TID) | ORAL | Status: DC | PRN

## 2013-08-06 NOTE — Assessment & Plan Note (Signed)
Doing much better, a small extra course of hydrocodone given. This will just take the tincture of time.

## 2013-08-06 NOTE — Progress Notes (Signed)
  Subjective:    CC: Followup  HPI: Right scapular fracture: Pain continues to improve. Doing well with occasional hydrocodone.  Left shoulder pain: Improving slightly with nitroglycerin patches, starts physical therapy next week, he did not respond to a subacromial injection.  Past medical history, Surgical history, Family history not pertinant except as noted below, Social history, Allergies, and medications have been entered into the medical record, reviewed, and no changes needed.   Review of Systems: No fevers, chills, night sweats, weight loss, chest pain, or shortness of breath.   Objective:    General: Well Developed, well nourished, and in no acute distress.  Neuro: Alert and oriented x3, extra-ocular muscles intact, sensation grossly intact.  HEENT: Normocephalic, atraumatic, pupils equal round reactive to light, neck supple, no masses, no lymphadenopathy, thyroid nonpalpable.  Skin: Warm and dry, no rashes. Cardiac: Regular rate and rhythm, no murmurs rubs or gallops, no lower extremity edema.  Respiratory: Clear to auscultation bilaterally. Not using accessory muscles, speaking in full sentences. Minimal tenderness to palpation at the inferior angle of the scapula on the right side.  Impression and Recommendations:

## 2013-08-06 NOTE — Assessment & Plan Note (Signed)
Improving slightly with nitroglycerin patches, continue physical therapy. Return in a month.

## 2013-08-08 ENCOUNTER — Other Ambulatory Visit: Payer: Self-pay | Admitting: Sports Medicine

## 2013-08-09 ENCOUNTER — Other Ambulatory Visit: Payer: Self-pay | Admitting: Sports Medicine

## 2013-08-11 ENCOUNTER — Ambulatory Visit (INDEPENDENT_AMBULATORY_CARE_PROVIDER_SITE_OTHER): Payer: Commercial Managed Care - HMO | Admitting: Physical Therapy

## 2013-08-11 DIAGNOSIS — M6281 Muscle weakness (generalized): Secondary | ICD-10-CM | POA: Diagnosis not present

## 2013-08-11 DIAGNOSIS — M25519 Pain in unspecified shoulder: Secondary | ICD-10-CM

## 2013-08-11 DIAGNOSIS — R293 Abnormal posture: Secondary | ICD-10-CM | POA: Diagnosis not present

## 2013-08-18 ENCOUNTER — Encounter (INDEPENDENT_AMBULATORY_CARE_PROVIDER_SITE_OTHER): Payer: Medicare HMO

## 2013-08-18 DIAGNOSIS — R293 Abnormal posture: Secondary | ICD-10-CM

## 2013-08-18 DIAGNOSIS — M6281 Muscle weakness (generalized): Secondary | ICD-10-CM

## 2013-08-18 DIAGNOSIS — M25519 Pain in unspecified shoulder: Secondary | ICD-10-CM

## 2013-08-20 ENCOUNTER — Other Ambulatory Visit: Payer: Self-pay | Admitting: Sports Medicine

## 2013-08-20 ENCOUNTER — Encounter (INDEPENDENT_AMBULATORY_CARE_PROVIDER_SITE_OTHER): Payer: Commercial Managed Care - HMO | Admitting: Physical Therapy

## 2013-08-20 DIAGNOSIS — M25519 Pain in unspecified shoulder: Secondary | ICD-10-CM

## 2013-08-20 DIAGNOSIS — R293 Abnormal posture: Secondary | ICD-10-CM

## 2013-08-20 DIAGNOSIS — M6281 Muscle weakness (generalized): Secondary | ICD-10-CM

## 2013-08-23 ENCOUNTER — Other Ambulatory Visit: Payer: Self-pay | Admitting: Sports Medicine

## 2013-08-25 ENCOUNTER — Encounter (INDEPENDENT_AMBULATORY_CARE_PROVIDER_SITE_OTHER): Payer: Medicare HMO

## 2013-08-25 DIAGNOSIS — M6281 Muscle weakness (generalized): Secondary | ICD-10-CM

## 2013-08-25 DIAGNOSIS — R293 Abnormal posture: Secondary | ICD-10-CM

## 2013-08-25 DIAGNOSIS — M25519 Pain in unspecified shoulder: Secondary | ICD-10-CM

## 2013-08-27 ENCOUNTER — Encounter (INDEPENDENT_AMBULATORY_CARE_PROVIDER_SITE_OTHER): Payer: Medicare HMO | Admitting: Physical Therapy

## 2013-08-27 DIAGNOSIS — M6281 Muscle weakness (generalized): Secondary | ICD-10-CM

## 2013-08-27 DIAGNOSIS — M25519 Pain in unspecified shoulder: Secondary | ICD-10-CM

## 2013-08-27 DIAGNOSIS — R293 Abnormal posture: Secondary | ICD-10-CM

## 2013-09-01 ENCOUNTER — Encounter (INDEPENDENT_AMBULATORY_CARE_PROVIDER_SITE_OTHER): Payer: Medicare HMO | Admitting: Physical Therapy

## 2013-09-01 DIAGNOSIS — M25519 Pain in unspecified shoulder: Secondary | ICD-10-CM

## 2013-09-01 DIAGNOSIS — M6281 Muscle weakness (generalized): Secondary | ICD-10-CM

## 2013-09-01 DIAGNOSIS — R293 Abnormal posture: Secondary | ICD-10-CM

## 2013-09-03 ENCOUNTER — Ambulatory Visit (INDEPENDENT_AMBULATORY_CARE_PROVIDER_SITE_OTHER): Payer: Commercial Managed Care - HMO | Admitting: Sports Medicine

## 2013-09-03 VITALS — BP 145/68 | HR 57 | Temp 97.7°F | Resp 16 | Ht 64.0 in | Wt 159.0 lb

## 2013-09-03 DIAGNOSIS — M159 Polyosteoarthritis, unspecified: Secondary | ICD-10-CM

## 2013-09-03 DIAGNOSIS — M150 Primary generalized (osteo)arthritis: Secondary | ICD-10-CM

## 2013-09-03 DIAGNOSIS — M25512 Pain in left shoulder: Secondary | ICD-10-CM

## 2013-09-03 DIAGNOSIS — M25519 Pain in unspecified shoulder: Secondary | ICD-10-CM | POA: Diagnosis not present

## 2013-09-03 MED ORDER — HYDROCODONE-ACETAMINOPHEN 5-325 MG PO TABS: 1.0000 | ORAL_TABLET | Freq: Three times a day (TID) | ORAL | Status: DC | PRN

## 2013-09-03 NOTE — Progress Notes (Signed)
  Subjective:    CC: Shoulder Pain  HPI: Patient continues to have pain in the anterior aspect of his left shoulder especially pronounced with abduction. Physical therapy has improved his strength slightly, and he estimates his improvement at 30-40%. The hydrocodone has done an excellent job of managing his pain, however, it has also resulted in constipation. He worries about his ability to care for his son. He no longer has pain at the inferior scapular fracture. Symptoms are moderate, persistent.  Past medical history, Surgical history, Family history not pertinant except as noted below, Social history, Allergies, and medications have been entered into the medical record, reviewed, and no changes needed.   Review of Systems: No fevers, chills, night sweats, weight loss, chest pain, or shortness of breath.   Objective:    General: Well Developed, well nourished, and in no acute distress.  Neuro: Alert and oriented x3, extra-ocular muscles intact, sensation grossly intact.  HEENT: Normocephalic, atraumatic, pupils equal round reactive to light, neck supple, no masses, no lymphadenopathy, thyroid nonpalpable.  Skin: Warm and dry, no rashes. Respiratory: Not using accessory muscles, speaking in full sentences. Left Shoulder: Inspection reveals no abnormalities, atrophy or asymmetry. Tender to palpation anteriorly over the bicipital groove. ROM is full in all planes. Rotator cuff strength normal throughout. No signs of impingement with negative Neer and Hawkin's tests, empty can. Positive speed test, negative Yergason test, pain is referred to the bicipital groove. No labral pathology noted with negative Obrien's, negative crank, negative clunk, and good stability. Normal scapular function observed. No painful arc and no drop arm sign. No apprehension sign  Procedure: Real-time Ultrasound Guided Injection of left biceps tendon sheath Device: GE Logiq E  Verbal informed consent obtained.    Time-out conducted.  Noted no overlying erythema, induration, or other signs of local infection.  Skin prepped in a sterile fashion.  Local anesthesia: Topical Ethyl chloride.  With sterile technique and under real time ultrasound guidance:  25-gauge needle advanced into the biceps tendon sheath, 1 cc kenalog 40, 4 cc lidocaine injected easily taking care to avoid intratendinous injection. Completed without difficulty  Pain immediately resolved suggesting accurate placement of the medication.  Advised to call if fevers/chills, erythema, induration, drainage, or persistent bleeding.  Images permanently stored and available for review in the ultrasound unit.  Impression: Technically successful ultrasound guided injection.  Impression and Recommendations:    His pain continues to be well managed on hydrocodone, however, this is not a good long term solution. We will be referring him for a consultation with a shoulder surgeon to consider endoscopic intervention. Refilling his vicodin; continuing nitroglycerin and physical therapy.

## 2013-09-03 NOTE — Assessment & Plan Note (Signed)
Pain is persistent despite physical therapy and several subacromial injections.  Slight improvement but insufficient with topical nitroglycerin. Pain is predominantly referable to the biceps tendon, biceps tendon sheath was injected under guidance, I refilled his pain medication. I would like him to see Dr. Tamera Punt with shoulder surgery for consideration of biceps tenotomy.

## 2013-09-06 ENCOUNTER — Other Ambulatory Visit: Payer: Self-pay | Admitting: Sports Medicine

## 2013-09-13 ENCOUNTER — Ambulatory Visit (INDEPENDENT_AMBULATORY_CARE_PROVIDER_SITE_OTHER): Payer: Medicare HMO | Admitting: Sports Medicine

## 2013-09-13 ENCOUNTER — Encounter: Payer: Self-pay | Admitting: Sports Medicine

## 2013-09-13 VITALS — BP 110/65 | HR 76 | Ht 65.0 in | Wt 156.0 lb

## 2013-09-13 DIAGNOSIS — M25519 Pain in unspecified shoulder: Secondary | ICD-10-CM

## 2013-09-13 DIAGNOSIS — M25512 Pain in left shoulder: Secondary | ICD-10-CM

## 2013-09-13 NOTE — Progress Notes (Signed)
  Subjective:    CC: Followup  HPI: Left shoulder pain: We have now tried physical therapy, NSAIDs, subacromial injection several times, glenohumeral, acromioclavicular, and biceps tendon sheath injections, Malike continues to have pain that is predominantly referable to the biceps tendon, I do think he needs surgical intervention.  Past medical history, Surgical history, Family history not pertinant except as noted below, Social history, Allergies, and medications have been entered into the medical record, reviewed, and no changes needed.   Review of Systems: No fevers, chills, night sweats, weight loss, chest pain, or shortness of breath.   Objective:    General: Well Developed, well nourished, and in no acute distress.  Neuro: Alert and oriented x3, extra-ocular muscles intact, sensation grossly intact.  HEENT: Normocephalic, atraumatic, pupils equal round reactive to light, neck supple, no masses, no lymphadenopathy, thyroid nonpalpable.  Skin: Warm and dry, no rashes. Cardiac: Regular rate and rhythm, no murmurs rubs or gallops, no lower extremity edema.  Respiratory: Clear to auscultation bilaterally. Not using accessory muscles, speaking in full sentences.  Impression and Recommendations:

## 2013-09-13 NOTE — Assessment & Plan Note (Signed)
Persistent pain despite subacromial, glenohumeral, and biceps tendon sheath injections. He does have an appointment with Dr. Tamera Punt shoulder surgery on Wednesday. He was getting some constipation probably associated with his hydrocodone, this has resolved with Linzess.

## 2013-09-15 ENCOUNTER — Telehealth: Payer: Self-pay | Admitting: Sports Medicine

## 2013-09-15 NOTE — Telephone Encounter (Signed)
Suanne Marker Wife called about appt scheduled for 8/21 for husband.  Did you ever talk to Dr T about this appt? Thank you kindly.

## 2013-09-16 NOTE — Telephone Encounter (Signed)
patrient has been informed that appt was cancelled due to him coming in early to be seen, instead his wife took that appt time. Rhonda Cunningham,CMA

## 2013-09-17 ENCOUNTER — Ambulatory Visit: Payer: Medicare HMO | Admitting: Sports Medicine

## 2013-09-24 ENCOUNTER — Other Ambulatory Visit: Payer: Self-pay | Admitting: Orthopedic Surgery

## 2013-09-24 DIAGNOSIS — R52 Pain, unspecified: Secondary | ICD-10-CM

## 2013-09-25 ENCOUNTER — Other Ambulatory Visit: Payer: Self-pay | Admitting: Sports Medicine

## 2013-09-26 NOTE — Telephone Encounter (Signed)
Would you like me to schedule him an appointment to discuss how he is doing on this [place]? What is the diagnosis for this medication?

## 2013-09-29 ENCOUNTER — Ambulatory Visit (INDEPENDENT_AMBULATORY_CARE_PROVIDER_SITE_OTHER): Payer: Medicare HMO

## 2013-09-29 DIAGNOSIS — R52 Pain, unspecified: Secondary | ICD-10-CM

## 2013-09-29 DIAGNOSIS — M25519 Pain in unspecified shoulder: Secondary | ICD-10-CM

## 2013-10-03 ENCOUNTER — Other Ambulatory Visit: Payer: Self-pay | Admitting: Sports Medicine

## 2013-10-25 ENCOUNTER — Other Ambulatory Visit: Payer: Self-pay

## 2013-10-25 MED ORDER — FUROSEMIDE 80 MG PO TABS: 80.0000 mg | ORAL_TABLET | Freq: Every day | ORAL | Status: DC

## 2013-10-26 ENCOUNTER — Other Ambulatory Visit: Payer: Self-pay | Admitting: Sports Medicine

## 2013-10-26 MED ORDER — GABAPENTIN 300 MG PO CAPS: 300.0000 mg | ORAL_CAPSULE | Freq: Three times a day (TID) | ORAL | Status: DC

## 2013-11-03 ENCOUNTER — Other Ambulatory Visit: Payer: Self-pay

## 2013-11-03 MED ORDER — ACYCLOVIR 800 MG PO TABS: 800.0000 mg | ORAL_TABLET | Freq: Three times a day (TID) | ORAL | Status: DC

## 2013-11-05 ENCOUNTER — Ambulatory Visit (INDEPENDENT_AMBULATORY_CARE_PROVIDER_SITE_OTHER): Payer: Commercial Managed Care - HMO | Admitting: Sports Medicine

## 2013-11-05 DIAGNOSIS — Z299 Encounter for prophylactic measures, unspecified: Secondary | ICD-10-CM

## 2013-11-05 DIAGNOSIS — Z23 Encounter for immunization: Secondary | ICD-10-CM

## 2013-11-05 NOTE — Assessment & Plan Note (Signed)
Influenza vaccine as above.

## 2013-11-05 NOTE — Progress Notes (Signed)
   Subjective:    Patient ID: Jeffery Hart, male    DOB: 01-Jul-1927, 80 y.o.   MRN: [id number]  HPI  Jeffery Hart is here for a flu vaccine.   Review of Systems     Objective:   Physical Exam        Assessment & Plan:  He received flu vaccine by Bo Mcclintock, CMA

## 2013-11-08 ENCOUNTER — Other Ambulatory Visit: Payer: Self-pay | Admitting: *Deleted

## 2013-11-08 MED ORDER — SIMVASTATIN 40 MG PO TABS: ORAL_TABLET | ORAL | Status: DC

## 2013-11-15 ENCOUNTER — Encounter: Payer: Self-pay | Admitting: Sports Medicine

## 2013-11-15 ENCOUNTER — Ambulatory Visit (INDEPENDENT_AMBULATORY_CARE_PROVIDER_SITE_OTHER): Payer: Medicare HMO | Admitting: Sports Medicine

## 2013-11-15 VITALS — BP 102/55 | HR 74 | Ht 65.0 in | Wt 159.0 lb

## 2013-11-15 DIAGNOSIS — I10 Essential (primary) hypertension: Secondary | ICD-10-CM

## 2013-11-15 DIAGNOSIS — M25512 Pain in left shoulder: Secondary | ICD-10-CM

## 2013-11-15 DIAGNOSIS — C4490 Unspecified malignant neoplasm of skin, unspecified: Secondary | ICD-10-CM

## 2013-11-15 DIAGNOSIS — M150 Primary generalized (osteo)arthritis: Secondary | ICD-10-CM

## 2013-11-15 HISTORY — DX: Unspecified malignant neoplasm of skin, unspecified: C44.90

## 2013-11-15 MED ORDER — CYCLOBENZAPRINE HCL 10 MG PO TABS: 10.0000 mg | ORAL_TABLET | Freq: Three times a day (TID) | ORAL | Status: DC | PRN

## 2013-11-15 MED ORDER — HYDROCODONE-ACETAMINOPHEN 5-325 MG PO TABS: 1.0000 | ORAL_TABLET | Freq: Three times a day (TID) | ORAL | Status: DC | PRN

## 2013-11-15 NOTE — Assessment & Plan Note (Signed)
Well controlled, no changes 

## 2013-11-15 NOTE — Assessment & Plan Note (Signed)
Overall doing well. Refilling pain medication and muscle relaxant.

## 2013-11-15 NOTE — Assessment & Plan Note (Signed)
Patient is established with Dr. Baltazar Najjar. Referral to dermatology.

## 2013-11-15 NOTE — Assessment & Plan Note (Signed)
Combination of glenohumeral osteoarthritis and rotator cuff disease. Recently saw Dr. Tamera Punt who did not think he would be a good surgical candidate. Dr. Tamera Punt did an unguided glenohumeral injection. Overall patient feels as though the pain is tolerable. Refilling medication.

## 2013-11-15 NOTE — Progress Notes (Signed)
  Subjective:    CC: Followup  HPI: Left shoulder pain: Glenohumeral osteoarthritis and rotator cuff dysfunction, he did see Dr. Tamera Punt who recommended further injections and no operative intervention at this point. He was given a glenohumeral injection from an anterior approach.  Wrist osteoarthritis: Overall doing okay, takes occasional hydrocodone, less than 30 per month.  Past medical history, Surgical history, Family history not pertinant except as noted below, Social history, Allergies, and medications have been entered into the medical record, reviewed, and no changes needed.   Review of Systems: No fevers, chills, night sweats, weight loss, chest pain, or shortness of breath.   Objective:    General: Well Developed, well nourished, and in no acute distress.  Neuro: Alert and oriented x3, extra-ocular muscles intact, sensation grossly intact.  HEENT: Normocephalic, atraumatic, pupils equal round reactive to light, neck supple, no masses, no lymphadenopathy, thyroid nonpalpable.  Skin: Warm and dry, no rashes. Cardiac: Regular rate and rhythm, no murmurs rubs or gallops, no lower extremity edema.  Respiratory: Clear to auscultation bilaterally. Not using accessory muscles, speaking in full sentences.  Impression and Recommendations:

## 2013-11-20 ENCOUNTER — Other Ambulatory Visit: Payer: Self-pay | Admitting: Sports Medicine

## 2013-12-07 ENCOUNTER — Other Ambulatory Visit: Payer: Self-pay | Admitting: Sports Medicine

## 2013-12-16 ENCOUNTER — Encounter: Payer: Self-pay | Admitting: Emergency Medicine

## 2013-12-16 ENCOUNTER — Emergency Department
Admission: EM | Admit: 2013-12-16 | Discharge: 2013-12-16 | Disposition: A | Discharge status: Home / Self Care | Payer: Commercial Managed Care - HMO | Source: Home / Self Care | Attending: Family Medicine | Admitting: Family Medicine

## 2013-12-16 DIAGNOSIS — H01001 Unspecified blepharitis right upper eyelid: Secondary | ICD-10-CM

## 2013-12-16 MED ORDER — ERYTHROMYCIN 5 MG/GM OP OINT: TOPICAL_OINTMENT | OPHTHALMIC | Status: DC

## 2013-12-16 MED ORDER — CEPHALEXIN 500 MG PO CAPS: 500.0000 mg | ORAL_CAPSULE | Freq: Two times a day (BID) | ORAL | Status: DC

## 2013-12-16 NOTE — Discharge Instructions (Signed)
Begin applying warm compress several times daily.  If symptoms become significantly worse during the night or over the weekend, proceed to the local emergency room.  Check Blood Pressure and followup with family doctor if remains elevated.   Blepharitis Blepharitis is redness, soreness, and swelling (inflammation) of one or both eyelids. It may be caused by an allergic reaction or a bacterial infection. Blepharitis may also be associated with reddened, scaly skin (seborrhea) of the scalp and eyebrows. While you sleep, eye discharge may cause your eyelashes to stick together. Your eyelids may itch, burn, swell, and may lose their lashes. These will grow back. Your eyes may become sensitive. Blepharitis may recur and need repeated treatment. If this is the case, you may require further evaluation by an eye specialist (ophthalmologist). HOME CARE INSTRUCTIONS   Keep your hands clean.  Use a clean towel each time you dry your eyelids. Do not use this towel to clean other areas. Do not share a towel or makeup with anyone.  Wash your eyelids with warm water or warm water mixed with a small amount of baby shampoo. Do this twice a day or as often as needed.  Wash your face and eyebrows at least once a day.  Use warm compresses 2 times a day for 10 minutes at a time, or as directed by your caregiver.  Apply antibiotic ointment as directed by your caregiver.  Avoid rubbing your eyes.  Avoid wearing makeup until you get better.  Follow up with your caregiver as directed. SEEK IMMEDIATE MEDICAL CARE IF:   You have pain, redness, or swelling that gets worse or spreads to other parts of your face.  Your vision changes, or you have pain when looking at lights or moving objects.  You have a fever.  Your symptoms continue for longer than 2 to 4 days or become worse. MAKE SURE YOU:   Understand these instructions.  Will watch your condition.  Will get help right away if you are not doing well  or get worse. Document Released: 01/12/2000 Document Revised: 04/08/2011 Document Reviewed: 02/21/2010 [facility] Patient Information 2015 [place], [place]. This information is not intended to replace advice given to you by your health care provider. Make sure you discuss any questions you have with your health care provider.

## 2013-12-16 NOTE — ED Notes (Signed)
Rt eyelid red and swelling since yesterday morning

## 2013-12-16 NOTE — ED Provider Notes (Addendum)
CSN: [id number]     Arrival date & time 12/16/13  1918 History   First MD Initiated Contact with Patient 12/16/13 1925     Chief Complaint  Patient presents with  . Eye Problem      HPI Comments: Patient complains of onset of mild soreness and redness at the lateral aspect of his right eye yesterday.  Today he has developed swelling, redness, and itching of his right upper lid.  He has also developed erythema beneath his right eye.  He denies foreign body sensation or change in vision.  Patient is a 80 y.o. male presenting with eye problem. The history is provided by the patient and the spouse.  Eye Problem Location:  R eye Quality: itching. Severity:  Mild Onset quality:  Sudden Duration:  1 day Timing:  Constant Progression:  Worsening Chronicity:  New Relieved by:  Nothing Worsened by:  Contact Ineffective treatments: warm compress. Associated symptoms: inflammation, itching, redness, swelling and tearing   Associated symptoms: no blurred vision, no crusting, no decreased vision, no discharge, no double vision, no facial rash, no foreign body sensation, no headaches, no nausea, no numbness and no photophobia     History reviewed. No pertinent past medical history. Past Surgical History  Procedure Laterality Date  . Tonsilectomy, adenoidectomy, bilateral myringotomy and tubes  1940  . Nasal septum surgery  1948, 1968  . Mandibular tori  [phone]  . Prostatectomy  1992  . Removal of nasal polyps and sinus tissue  1997  . Shoulder arthroscopy with subacromial decompression, rotator cuff repair and bicep tendon repair  1998  . Thumb joint repair Left 1999  . Radical orchiectomy  2004  . Thumb joint repair Right 2004  . Triple by-pass heart surgery  2006  . Lumbar laminectomy/ decompression with met-rx  2008  . Hernia repair  2009  . Maximum access (mas)posterior lumbar interbody fusion (plif) 1 level  01/12/2010  . Cataract extraction Left 06/13/2011    eye  .  Cataract extraction Right 08/15/2011    eye  . Appendectomy  05/03/2012  . Uvulopalatopharyngoplasty  1996   History reviewed. No pertinent family history. History  Substance Use Topics  . Smoking status: Former Research scientist (life sciences)  . Smokeless tobacco: Not on file  . Alcohol Use: Not on file    Review of Systems  Constitutional: Negative for fever, chills and fatigue.  Eyes: Positive for redness and itching. Negative for blurred vision, double vision, photophobia and discharge.  Gastrointestinal: Negative for nausea.  Neurological: Negative for numbness and headaches.  All other systems reviewed and are negative.   Allergies  Review of patient's allergies indicates not on file.  Home Medications   Prior to Admission medications   Medication Sig Start Date End Date Taking? Authorizing Provider  acyclovir (ZOVIRAX) 800 MG tablet Take 1 tablet (800 mg total) by mouth 3 (three) times daily. 11/03/13   Silverio Decamp, MD  cephALEXin (KEFLEX) 500 MG capsule Take 1 capsule (500 mg total) by mouth 2 (two) times daily. 12/16/13   Kandra Nicolas, MD  clopidogrel (PLAVIX) 75 MG tablet TAKE 1 TABLET BY MOUTH ONCE DAILY 12/07/13   Silverio Decamp, MD  cyclobenzaprine (FLEXERIL) 10 MG tablet Take 1 tablet (10 mg total) by mouth every 8 (eight) hours as needed for muscle spasms. 11/15/13   Silverio Decamp, MD  doxepin (SINEQUAN) 25 MG capsule TAKE 1 CAPSULE (25 MG TOTAL) BY MOUTH AT BEDTIME. 09/27/13   Silverio Decamp, MD  erythromycin ophthalmic ointment Place a 1/2 inch ribbon of ointment into the lower eyelid q3 to 4hr 12/16/13   Stephen A Beese, MD  ferrous sulfate 325 (65 FE) MG tablet Take 325 mg by mouth daily with breakfast.    Historical Provider, MD  fluorouracil (EFUDEX) 5 % cream Apply topically 2 (two) times daily.    Historical Provider, MD  furosemide (LASIX) 80 MG tablet Take 1 tablet (80 mg total) by mouth daily. 10/25/13   Thomas J Thekkekandam, MD  gabapentin  (NEURONTIN) 300 MG capsule Take 1 capsule (300 mg total) by mouth 3 (three) times daily. 10/26/13   Thomas J Thekkekandam, MD  HYDROcodone-acetaminophen (NORCO/VICODIN) 5-325 MG per tablet Take 1 tablet by mouth every 8 (eight) hours as needed for moderate pain. 11/15/13   Thomas J Thekkekandam, MD  lisinopril (PRINIVIL,ZESTRIL) 10 MG tablet Take 1 tablet (10 mg total) by mouth daily. 05/27/13   Thomas J Thekkekandam, MD  nitroGLYCERIN (NITROSTAT) 0.4 MG SL tablet Place 0.4 mg under the tongue as needed for chest pain.    Historical Provider, MD  propranolol (INDERAL) 20 MG tablet Take 60 mg by mouth 2 (two) times daily. Take 30 mg 2 times daily    Historical Provider, MD  propranolol (INDERAL) 60 MG tablet TAKE 1/2 TABLET BY MOUTH TWICE DAILY 11/21/13   Thomas J Thekkekandam, MD  simvastatin (ZOCOR) 40 MG tablet TAKE 1 TABLET (40 MG TOTAL) BY MOUTH AT BEDTIME. 11/08/13   Thomas J Thekkekandam, MD  sodium chloride 1 G tablet Take 1 tablet (1 g total) by mouth 3 (three) times daily. 02/09/13   Thomas J Thekkekandam, MD   BP 208/77 mmHg  Pulse 66  Temp(Src) 97.5 F (36.4 C) (Oral)  Ht 5' 5" (1.651 m)  Wt 161 lb (73.029 kg)  BMI 26.79 kg/m2  SpO2 98% Physical Exam  Constitutional: He is oriented to person, place, and time. He appears well-developed and well-nourished. No distress.  HENT:  Head: Atraumatic.  Nose: Nose normal.  Mouth/Throat: Oropharynx is clear and moist.  Eyes: Conjunctivae and EOM are normal. Pupils are equal, round, and reactive to light. Right eye exhibits discharge. Right eye exhibits no exudate and no hordeolum. Left eye exhibits no discharge.    Right upper eyelid is edematous and mildly erythematous and tender to palpation.  Erythema extends to beneath eye as noted on diagram.  A small amount of mucoid eye drainage is present.    Neck: Neck supple.  Cardiovascular: Normal heart sounds.   Pulmonary/Chest: Breath sounds normal.  Musculoskeletal: He exhibits no edema.    Lymphadenopathy:    He has no cervical adenopathy.  Neurological: He is alert and oriented to person, place, and time.  Skin: Skin is warm and dry.  Nursing note and vitals reviewed.   ED Course  Procedures  none     MDM   1. Blepharitis of right upper eyelid    Begin erythromycin ophthalmic ointment and oral Keflex.  Begin applying warm compress several times daily.  If symptoms become significantly worse during the night or over the weekend, proceed to the local emergency room.  Followup with ophthalmologist if not improving 4 days. Check Blood Pressure and followup with family doctor if remains elevated.    Stephen A Beese, MD 12/17/13 1649  Stephen A Beese, MD 12/17/13 1650 

## 2014-01-15 ENCOUNTER — Other Ambulatory Visit: Payer: Self-pay | Admitting: Sports Medicine

## 2014-01-22 ENCOUNTER — Other Ambulatory Visit: Payer: Self-pay | Admitting: Sports Medicine

## 2014-02-03 ENCOUNTER — Other Ambulatory Visit: Payer: Self-pay | Admitting: Sports Medicine

## 2014-02-03 ENCOUNTER — Encounter: Payer: Self-pay | Admitting: *Deleted

## 2014-02-03 ENCOUNTER — Emergency Department (INDEPENDENT_AMBULATORY_CARE_PROVIDER_SITE_OTHER)
Admission: EM | Admit: 2014-02-03 | Discharge: 2014-02-03 | Disposition: A | Discharge status: Home / Self Care | Payer: Commercial Managed Care - HMO | Source: Home / Self Care | Attending: Family Medicine | Admitting: Family Medicine

## 2014-02-03 DIAGNOSIS — H00013 Hordeolum externum right eye, unspecified eyelid: Secondary | ICD-10-CM

## 2014-02-03 DIAGNOSIS — J069 Acute upper respiratory infection, unspecified: Secondary | ICD-10-CM

## 2014-02-03 DIAGNOSIS — H0012 Chalazion right lower eyelid: Secondary | ICD-10-CM | POA: Insufficient documentation

## 2014-02-03 MED ORDER — GUAIFENESIN-CODEINE 100-10 MG/5ML PO SOLN: 5.0000 mL | Freq: Every evening | ORAL | Status: DC | PRN

## 2014-02-03 MED ORDER — MUPIROCIN 2 % EX OINT: TOPICAL_OINTMENT | CUTANEOUS | Status: DC

## 2014-02-03 MED ORDER — IPRATROPIUM BROMIDE 0.06 % NA SOLN: 2.0000 | Freq: Four times a day (QID) | NASAL | Status: DC

## 2014-02-03 NOTE — Discharge Instructions (Signed)
Thank you for coming in today. Call or go to the emergency room if you get worse, have trouble breathing, have chest pains, or palpitations.    Upper Respiratory Infection, Adult An upper respiratory infection (URI) is also sometimes known as the common cold. The upper respiratory tract includes the nose, sinuses, throat, trachea, and bronchi. Bronchi are the airways leading to the lungs. Most people improve within 1 week, but symptoms can last up to 2 weeks. A residual cough may last even longer.  CAUSES Many different viruses can infect the tissues lining the upper respiratory tract. The tissues become irritated and inflamed and often become very moist. Mucus production is also common. A cold is contagious. You can easily spread the virus to others by oral contact. This includes kissing, sharing a glass, coughing, or sneezing. Touching your mouth or nose and then touching a surface, which is then touched by another person, can also spread the virus. SYMPTOMS  Symptoms typically develop 1 to 3 days after you come in contact with a cold virus. Symptoms vary from person to person. They may include:  Runny nose.  Sneezing.  Nasal congestion.  Sinus irritation.  Sore throat.  Loss of voice (laryngitis).  Cough.  Fatigue.  Muscle aches.  Loss of appetite.  Headache.  Low-grade fever. DIAGNOSIS  You might diagnose your own cold based on familiar symptoms, since most people get a cold 2 to 3 times a year. Your caregiver can confirm this based on your exam. Most importantly, your caregiver can check that your symptoms are not due to another disease such as strep throat, sinusitis, pneumonia, asthma, or epiglottitis. Blood tests, throat tests, and X-rays are not necessary to diagnose a common cold, but they may sometimes be helpful in excluding other more serious diseases. Your caregiver will decide if any further tests are required. RISKS AND COMPLICATIONS  You may be at risk for a more  severe case of the common cold if you smoke cigarettes, have chronic heart disease (such as heart failure) or lung disease (such as asthma), or if you have a weakened immune system. The very young and very old are also at risk for more serious infections. Bacterial sinusitis, middle ear infections, and bacterial pneumonia can complicate the common cold. The common cold can worsen asthma and chronic obstructive pulmonary disease (COPD). Sometimes, these complications can require emergency medical care and may be life-threatening. PREVENTION  The best way to protect against getting a cold is to practice good hygiene. Avoid oral or hand contact with people with cold symptoms. Wash your hands often if contact occurs. There is no clear evidence that vitamin C, vitamin E, echinacea, or exercise reduces the chance of developing a cold. However, it is always recommended to get plenty of rest and practice good nutrition. TREATMENT  Treatment is directed at relieving symptoms. There is no cure. Antibiotics are not effective, because the infection is caused by a virus, not by bacteria. Treatment may include:  Increased fluid intake. Sports drinks offer valuable electrolytes, sugars, and fluids.  Breathing heated mist or steam (vaporizer or shower).  Eating chicken soup or other clear broths, and maintaining good nutrition.  Getting plenty of rest.  Using gargles or lozenges for comfort.  Controlling fevers with ibuprofen or acetaminophen as directed by your caregiver.  Increasing usage of your inhaler if you have asthma. Zinc gel and zinc lozenges, taken in the first 24 hours of the common cold, can shorten the duration and lessen the severity  of symptoms. Pain medicines may help with fever, muscle aches, and throat pain. A variety of non-prescription medicines are available to treat congestion and runny nose. Your caregiver can make recommendations and may suggest nasal or lung inhalers for other symptoms.    HOME CARE INSTRUCTIONS   Only take over-the-counter or prescription medicines for pain, discomfort, or fever as directed by your caregiver.  Use a warm mist humidifier or inhale steam from a shower to increase air moisture. This may keep secretions moist and make it easier to breathe.  Drink enough water and fluids to keep your urine clear or pale yellow.  Rest as needed.  Return to work when your temperature has returned to normal or as your caregiver advises. You may need to stay home longer to avoid infecting others. You can also use a face mask and careful hand washing to prevent spread of the virus. SEEK MEDICAL CARE IF:   After the first few days, you feel you are getting worse rather than better.  You need your caregiver's advice about medicines to control symptoms.  You develop chills, worsening shortness of breath, or brown or red sputum. These may be signs of pneumonia.  You develop yellow or brown nasal discharge or pain in the face, especially when you bend forward. These may be signs of sinusitis.  You develop a fever, swollen neck glands, pain with swallowing, or white areas in the back of your throat. These may be signs of strep throat. SEEK IMMEDIATE MEDICAL CARE IF:   You have a fever.  You develop severe or persistent headache, ear pain, sinus pain, or chest pain.  You develop wheezing, a prolonged cough, cough up blood, or have a change in your usual mucus (if you have chronic lung disease).  You develop sore muscles or a stiff neck. Document Released: 07/10/2000 Document Revised: 04/08/2011 Document Reviewed: 04/21/2013 [facility] Patient Information 2015 [place], [place]. This information is not intended to replace advice given to you by your health care provider. Make sure you discuss any questions you have with your health care provider.

## 2014-02-03 NOTE — Assessment & Plan Note (Signed)
Right eye, has not responded to doxycycline or topical erythromycin prescribed in urgent care. Adding topical mupirocin. Return if no better in a week.

## 2014-02-03 NOTE — ED Provider Notes (Signed)
Jeffery Hart is a 81 y.o. male who presents to Urgent Care today for cough congestion runny nose. Symptoms present for a few days. No fevers chills nausea vomiting or diarrhea. No wheezing or shortness of breath. Multiple family members with similar symptoms.   History reviewed. No pertinent past medical history. Past Surgical History  Procedure Laterality Date  . Tonsilectomy, adenoidectomy, bilateral myringotomy and tubes  1940  . Nasal septum surgery  1948, 1968  . Mandibular tori  [phone]  . Prostatectomy  1992  . Removal of nasal polyps and sinus tissue  1997  . Shoulder arthroscopy with subacromial decompression, rotator cuff repair and bicep tendon repair  1998  . Thumb joint repair Left 1999  . Radical orchiectomy  2004  . Thumb joint repair Right 2004  . Triple by-pass heart surgery  2006  . Lumbar laminectomy/ decompression with met-rx  2008  . Hernia repair  2009  . Maximum access (mas)posterior lumbar interbody fusion (plif) 1 level  01/12/2010  . Cataract extraction Left 06/13/2011    eye  . Cataract extraction Right 08/15/2011    eye  . Appendectomy  05/03/2012  . Uvulopalatopharyngoplasty  1996   History  Substance Use Topics  . Smoking status: Former Research scientist (life sciences)  . Smokeless tobacco: Never Used  . Alcohol Use: No   ROS as above Medications: No current facility-administered medications for this encounter.   Current Outpatient Prescriptions  Medication Sig Dispense Refill  . acyclovir (ZOVIRAX) 800 MG tablet Take 1 tablet (800 mg total) by mouth 3 (three) times daily. 30 tablet 0  . clopidogrel (PLAVIX) 75 MG tablet TAKE 1 TABLET BY MOUTH ONCE DAILY 30 tablet 3  . cyclobenzaprine (FLEXERIL) 10 MG tablet Take 1 tablet (10 mg total) by mouth every 8 (eight) hours as needed for muscle spasms. 30 tablet 11  . doxepin (SINEQUAN) 25 MG capsule TAKE 1 CAPSULE (25 MG TOTAL) BY MOUTH AT BEDTIME. 30 capsule 3  . erythromycin ophthalmic ointment Place a 1/2 inch  ribbon of ointment into the lower eyelid q3 to 4hr 3.5 g 0  . ferrous sulfate 325 (65 FE) MG tablet Take 325 mg by mouth daily with breakfast.    . fluorouracil (EFUDEX) 5 % cream Apply topically 2 (two) times daily.    . furosemide (LASIX) 80 MG tablet TAKE 1 TABLET (80 MG TOTAL) BY MOUTH DAILY. 30 tablet 2  . gabapentin (NEURONTIN) 300 MG capsule Take 1 capsule (300 mg total) by mouth 3 (three) times daily. 270 capsule 3  . guaiFENesin-codeine 100-10 MG/5ML syrup Take 5 mLs by mouth at bedtime as needed for cough. 120 mL 0  . ipratropium (ATROVENT) 0.06 % nasal spray Place 2 sprays into both nostrils 4 (four) times daily. 15 mL 1  . lisinopril (PRINIVIL,ZESTRIL) 10 MG tablet Take 1 tablet (10 mg total) by mouth daily. 90 tablet 3  . nitroGLYCERIN (NITROSTAT) 0.4 MG SL tablet Place 0.4 mg under the tongue as needed for chest pain.    Marland Kitchen propranolol (INDERAL) 20 MG tablet Take 60 mg by mouth 2 (two) times daily. Take 30 mg 2 times daily    . simvastatin (ZOCOR) 40 MG tablet TAKE 1 TABLET (40 MG TOTAL) BY MOUTH AT BEDTIME. 30 tablet 3  . sodium chloride 1 G tablet Take 1 tablet (1 g total) by mouth 3 (three) times daily. 270 tablet 3   No Known Allergies   Exam:  BP 130/70 mmHg  Pulse 67  Temp(Src) 98 F (36.7 C) (  Oral)  Resp 14  Wt 162 lb (73.483 kg)  SpO2 96% Gen: Well NAD HEENT: EOMI,  MMM normal tympanic membranes. Posterior pharynx cobblestoning. Lungs: Normal work of breathing. CTABL Heart: RRR no MRG Abd: NABS, Soft. Nondistended, Nontender Exts: Brisk capillary refill, warm and well perfused.   No results found for this or any previous visit (from the past 24 hour(s)). No results found.  Assessment and Plan: 81 y.o. male with viral URI. Treatment with Atrovent nasal spray and codeine cough medication.  Discussed warning signs or symptoms. Please see discharge instructions. Patient expresses understanding.     Gregor Hams, MD 02/03/14 [phone]

## 2014-02-03 NOTE — ED Notes (Signed)
Jeffery Hart c/o productive cough and congestion x 3 days. Sputum is yellow. Denies fever. Taken robitussin otc.

## 2014-02-07 ENCOUNTER — Other Ambulatory Visit: Payer: Self-pay | Admitting: Sports Medicine

## 2014-02-07 MED ORDER — BENZONATATE 200 MG PO CAPS: 200.0000 mg | ORAL_CAPSULE | Freq: Three times a day (TID) | ORAL | Status: DC | PRN

## 2014-02-07 MED ORDER — DOXYCYCLINE HYCLATE 100 MG PO TABS: 100.0000 mg | ORAL_TABLET | Freq: Two times a day (BID) | ORAL | Status: AC

## 2014-02-13 ENCOUNTER — Other Ambulatory Visit: Payer: Self-pay | Admitting: Sports Medicine

## 2014-02-16 DIAGNOSIS — I10 Essential (primary) hypertension: Secondary | ICD-10-CM | POA: Diagnosis not present

## 2014-02-16 DIAGNOSIS — E871 Hypo-osmolality and hyponatremia: Secondary | ICD-10-CM | POA: Diagnosis not present

## 2014-02-16 DIAGNOSIS — I2510 Atherosclerotic heart disease of native coronary artery without angina pectoris: Secondary | ICD-10-CM | POA: Diagnosis not present

## 2014-02-19 DIAGNOSIS — G4733 Obstructive sleep apnea (adult) (pediatric): Secondary | ICD-10-CM | POA: Diagnosis not present

## 2014-02-20 ENCOUNTER — Other Ambulatory Visit: Payer: Self-pay | Admitting: Sports Medicine

## 2014-03-14 ENCOUNTER — Telehealth: Payer: Self-pay

## 2014-03-14 MED ORDER — SIMVASTATIN 40 MG PO TABS: ORAL_TABLET | ORAL | Status: DC

## 2014-03-14 NOTE — Telephone Encounter (Signed)
[facility] request refill for Simvastatin 40 #30 3 R was sent to pharmacy. Jeffery Hart,CMA

## 2014-03-22 DIAGNOSIS — G4733 Obstructive sleep apnea (adult) (pediatric): Secondary | ICD-10-CM | POA: Diagnosis not present

## 2014-03-25 ENCOUNTER — Encounter: Payer: Self-pay | Admitting: Physician Assistant

## 2014-03-25 ENCOUNTER — Ambulatory Visit (INDEPENDENT_AMBULATORY_CARE_PROVIDER_SITE_OTHER): Payer: Commercial Managed Care - HMO | Admitting: Physician Assistant

## 2014-03-25 VITALS — BP 131/73 | HR 65 | Wt 145.0 lb

## 2014-03-25 DIAGNOSIS — H00019 Hordeolum externum unspecified eye, unspecified eyelid: Secondary | ICD-10-CM

## 2014-03-25 DIAGNOSIS — H01003 Unspecified blepharitis right eye, unspecified eyelid: Secondary | ICD-10-CM

## 2014-03-25 DIAGNOSIS — H01006 Unspecified blepharitis left eye, unspecified eyelid: Secondary | ICD-10-CM

## 2014-03-25 MED ORDER — CEPHALEXIN 500 MG PO CAPS: 500.0000 mg | ORAL_CAPSULE | Freq: Two times a day (BID) | ORAL | Status: DC

## 2014-03-25 NOTE — Patient Instructions (Signed)

## 2014-03-25 NOTE — Progress Notes (Signed)
   Subjective:    Patient ID: Jeffery Hart, male    DOB: 10-15-1927, 81 y.o.   MRN: [id number]  HPI Patient is an 81 year old male who presents to the clinic with bilateral eye lesions and watery discharge. He is no cyst for the last couple days. He feels like this is the same thing he had back in November of last year. He has been given Keflex and doxycycline in the past. He seemingly the doxycycline helped some but he needed more. It did resolve the Keflex originally. He has seen ophthalmology in December of this year. He was given a topical solution to use. He is used that and has not helping. He denies any other symptoms such as sinus pressure, fever, chills, cough or sore throat. He denies any vision changes. These papules on his eye are slightly tender but more itchy.   Review of Systems  All other systems reviewed and are negative.      Objective:   Physical Exam  Constitutional: He is oriented to person, place, and time. He appears well-developed and well-nourished.  HENT:  Head: Normocephalic and atraumatic.  Eyes:    Cardiovascular: Normal rate, regular rhythm and normal heart sounds.   Pulmonary/Chest: Effort normal and breath sounds normal.  Neurological: He is alert and oriented to person, place, and time.  Psychiatric: He has a normal mood and affect. His behavior is normal.          Assessment & Plan:  Stye/blepharitis- per pt keflex worked better than doxy. Sent keflex to pharmacy. Continue to use topical ointment given by opthalmology. If not improving follow up with opthalmology. No vision changes. Encouraged warm compresses. Discussed right lower eyelid appeared a little vesicular which concerning for other etiology such as herpes. Follow up if not improving.

## 2014-03-28 ENCOUNTER — Other Ambulatory Visit: Payer: Self-pay | Admitting: Sports Medicine

## 2014-04-11 ENCOUNTER — Other Ambulatory Visit: Payer: Self-pay | Admitting: Sports Medicine

## 2014-04-19 ENCOUNTER — Encounter: Payer: Self-pay | Admitting: Sports Medicine

## 2014-04-19 ENCOUNTER — Ambulatory Visit (INDEPENDENT_AMBULATORY_CARE_PROVIDER_SITE_OTHER): Payer: Commercial Managed Care - HMO | Admitting: Sports Medicine

## 2014-04-19 VITALS — BP 160/82 | HR 46 | Ht 63.0 in | Wt 158.0 lb

## 2014-04-19 DIAGNOSIS — Z0000 Encounter for general adult medical examination without abnormal findings: Secondary | ICD-10-CM

## 2014-04-19 DIAGNOSIS — Z23 Encounter for immunization: Secondary | ICD-10-CM | POA: Diagnosis not present

## 2014-04-19 DIAGNOSIS — H00013 Hordeolum externum right eye, unspecified eyelid: Secondary | ICD-10-CM

## 2014-04-19 DIAGNOSIS — E785 Hyperlipidemia, unspecified: Secondary | ICD-10-CM | POA: Diagnosis not present

## 2014-04-19 DIAGNOSIS — I2583 Coronary atherosclerosis due to lipid rich plaque: Secondary | ICD-10-CM

## 2014-04-19 DIAGNOSIS — M150 Primary generalized (osteo)arthritis: Secondary | ICD-10-CM

## 2014-04-19 DIAGNOSIS — I2510 Atherosclerotic heart disease of native coronary artery without angina pectoris: Secondary | ICD-10-CM

## 2014-04-19 DIAGNOSIS — H6123 Impacted cerumen, bilateral: Secondary | ICD-10-CM

## 2014-04-19 DIAGNOSIS — I10 Essential (primary) hypertension: Secondary | ICD-10-CM

## 2014-04-19 DIAGNOSIS — Z8546 Personal history of malignant neoplasm of prostate: Secondary | ICD-10-CM

## 2014-04-19 DIAGNOSIS — Z79899 Other long term (current) drug therapy: Secondary | ICD-10-CM | POA: Diagnosis not present

## 2014-04-19 LAB — EKG 12-LEAD

## 2014-04-19 MED ORDER — DOXYCYCLINE HYCLATE 100 MG PO TABS: 100.0000 mg | ORAL_TABLET | Freq: Every day | ORAL | Status: AC

## 2014-04-19 MED ORDER — DOXYCYCLINE HYCLATE 100 MG PO TABS: 100.0000 mg | ORAL_TABLET | Freq: Every day | ORAL | Status: DC

## 2014-04-19 MED ORDER — HYDROCODONE-ACETAMINOPHEN 5-325 MG PO TABS: 1.0000 | ORAL_TABLET | Freq: Three times a day (TID) | ORAL | Status: DC | PRN

## 2014-04-19 NOTE — Addendum Note (Signed)
Addended by: Silverio Decamp on: 04/19/2014 03:21 PM   Modules accepted: Orders

## 2014-04-19 NOTE — Assessment & Plan Note (Signed)
Checking PSA. Prostate cancer, would like me to perform routine surveillance. Patient understands even an elevated level will likely not require intervention at his age.

## 2014-04-19 NOTE — Assessment & Plan Note (Signed)
Medicare physical as above.  

## 2014-04-19 NOTE — Progress Notes (Signed)
Subjective:    Jeffery Hart is a 81 y.o. male who presents for Medicare Annual/Subsequent preventive examination.   Preventive Screening-Counseling & Management  Tobacco History  Smoking status  . Former Smoker  Smokeless tobacco  . Never Used    Problems Prior to Visit 1.   Current Problems (verified) Patient Active Problem List   Diagnosis Date Noted  . Blepharitis of both eyes 03/25/2014  . Stye 02/03/2014  . Skin cancer 11/15/2013  . Left shoulder pain 04/09/2013  . Primary generalized (osteo)arthritis 02/09/2013  . Essential hypertension, benign 01/29/2013  . CAD (coronary artery disease) status post bypass grafting 01/29/2013  . Preventive measure 01/29/2013  . History of prostate cancer 01/29/2013  . Lymphedema of right leg 01/29/2013  . Hyperlipidemia 01/29/2013    Medications Prior to Visit Current Outpatient Prescriptions on File Prior to Visit  Medication Sig Dispense Refill  . acyclovir (ZOVIRAX) 800 MG tablet Take 1 tablet (800 mg total) by mouth 3 (three) times daily. 30 tablet 0  . benzonatate (TESSALON) 200 MG capsule Take 1 capsule (200 mg total) by mouth 3 (three) times daily as needed for cough. 45 capsule 0  . cephALEXin (KEFLEX) 500 MG capsule Take 1 capsule (500 mg total) by mouth 2 (two) times daily. For 10 days. 20 capsule 0  . clopidogrel (PLAVIX) 75 MG tablet TAKE 1 TABLET BY MOUTH ONCE DAILY 30 tablet 3  . cyclobenzaprine (FLEXERIL) 10 MG tablet Take 1 tablet (10 mg total) by mouth every 8 (eight) hours as needed for muscle spasms. 30 tablet 11  . doxepin (SINEQUAN) 25 MG capsule TAKE 1 CAPSULE (25 MG TOTAL) BY MOUTH AT BEDTIME. 30 capsule 3  . DOXYCYCLINE HYCLATE PO Take 100 mg by mouth 2 (two) times daily.    Marland Kitchen erythromycin ophthalmic ointment Place a 1/2 inch ribbon of ointment into the lower eyelid q3 to 4hr 3.5 g 0  . ferrous sulfate 325 (65 FE) MG tablet Take 325 mg by mouth daily with breakfast.    . fluorouracil (EFUDEX) 5 % cream  Apply topically 2 (two) times daily.    . furosemide (LASIX) 80 MG tablet TAKE 1 TABLET (80 MG TOTAL) BY MOUTH DAILY. 30 tablet 2  . gabapentin (NEURONTIN) 300 MG capsule Take 1 capsule (300 mg total) by mouth 3 (three) times daily. 270 capsule 3  . guaiFENesin-codeine 100-10 MG/5ML syrup Take 5 mLs by mouth at bedtime as needed for cough. 120 mL 0  . ipratropium (ATROVENT) 0.06 % nasal spray Place 2 sprays into both nostrils 4 (four) times daily. 15 mL 1  . lisinopril (PRINIVIL,ZESTRIL) 10 MG tablet Take 1 tablet (10 mg total) by mouth daily. 90 tablet 3  . mupirocin ointment (BACTROBAN) 2 % Apply to affected area TID for 7 days. 30 g 3  . nitroGLYCERIN (NITROSTAT) 0.4 MG SL tablet Place 0.4 mg under the tongue as needed for chest pain.    Marland Kitchen propranolol (INDERAL) 20 MG tablet Take 60 mg by mouth 2 (two) times daily. Take 30 mg 2 times daily    . propranolol (INDERAL) 60 MG tablet TAKE 1/2 TABLET BY MOUTH TWICE DAILY 90 tablet 0  . simvastatin (ZOCOR) 40 MG tablet TAKE 1 TABLET (40 MG TOTAL) BY MOUTH AT BEDTIME. 30 tablet 3  . sodium chloride 1 G tablet TAKE 1 TABLET (1 G TOTAL) BY MOUTH 3 (THREE) TIMES DAILY. 270 tablet 3   No current facility-administered medications on file prior to visit.    Current  Medications (verified) Current Outpatient Prescriptions  Medication Sig Dispense Refill  . acyclovir (ZOVIRAX) 800 MG tablet Take 1 tablet (800 mg total) by mouth 3 (three) times daily. 30 tablet 0  . benzonatate (TESSALON) 200 MG capsule Take 1 capsule (200 mg total) by mouth 3 (three) times daily as needed for cough. 45 capsule 0  . cephALEXin (KEFLEX) 500 MG capsule Take 1 capsule (500 mg total) by mouth 2 (two) times daily. For 10 days. 20 capsule 0  . clopidogrel (PLAVIX) 75 MG tablet TAKE 1 TABLET BY MOUTH ONCE DAILY 30 tablet 3  . cyclobenzaprine (FLEXERIL) 10 MG tablet Take 1 tablet (10 mg total) by mouth every 8 (eight) hours as needed for muscle spasms. 30 tablet 11  . doxepin  (SINEQUAN) 25 MG capsule TAKE 1 CAPSULE (25 MG TOTAL) BY MOUTH AT BEDTIME. 30 capsule 3  . DOXYCYCLINE HYCLATE PO Take 100 mg by mouth 2 (two) times daily.    Marland Kitchen erythromycin ophthalmic ointment Place a 1/2 inch ribbon of ointment into the lower eyelid q3 to 4hr 3.5 g 0  . ferrous sulfate 325 (65 FE) MG tablet Take 325 mg by mouth daily with breakfast.    . fluorouracil (EFUDEX) 5 % cream Apply topically 2 (two) times daily.    . furosemide (LASIX) 80 MG tablet TAKE 1 TABLET (80 MG TOTAL) BY MOUTH DAILY. 30 tablet 2  . gabapentin (NEURONTIN) 300 MG capsule Take 1 capsule (300 mg total) by mouth 3 (three) times daily. 270 capsule 3  . guaiFENesin-codeine 100-10 MG/5ML syrup Take 5 mLs by mouth at bedtime as needed for cough. 120 mL 0  . ipratropium (ATROVENT) 0.06 % nasal spray Place 2 sprays into both nostrils 4 (four) times daily. 15 mL 1  . lisinopril (PRINIVIL,ZESTRIL) 10 MG tablet Take 1 tablet (10 mg total) by mouth daily. 90 tablet 3  . mupirocin ointment (BACTROBAN) 2 % Apply to affected area TID for 7 days. 30 g 3  . nitroGLYCERIN (NITROSTAT) 0.4 MG SL tablet Place 0.4 mg under the tongue as needed for chest pain.    Marland Kitchen propranolol (INDERAL) 20 MG tablet Take 60 mg by mouth 2 (two) times daily. Take 30 mg 2 times daily    . propranolol (INDERAL) 60 MG tablet TAKE 1/2 TABLET BY MOUTH TWICE DAILY 90 tablet 0  . simvastatin (ZOCOR) 40 MG tablet TAKE 1 TABLET (40 MG TOTAL) BY MOUTH AT BEDTIME. 30 tablet 3  . sodium chloride 1 G tablet TAKE 1 TABLET (1 G TOTAL) BY MOUTH 3 (THREE) TIMES DAILY. 270 tablet 3   No current facility-administered medications for this visit.     Allergies (verified) Review of patient's allergies indicates no known allergies.   PAST HISTORY  Family History No family history on file.  Social History History  Substance Use Topics  . Smoking status: Former Research scientist (life sciences)  . Smokeless tobacco: Never Used  . Alcohol Use: No    Are there smokers in your home (other  than you)?  No  Risk Factors Current exercise habits: regular walking  Dietary issues discussed: No   Cardiac risk factors: advanced age (older than 27 for men, 66 for women), dyslipidemia and hypertension.  Depression Screen (Note: if answer to either of the following is "Yes", a more complete depression screening is indicated)   Q1: Over the past two weeks, have you felt down, depressed or hopeless? No  Q2: Over the past two weeks, have you felt little interest or pleasure in  doing things? No  Have you lost interest or pleasure in daily life? No  Do you often feel hopeless? No  Do you cry easily over simple problems? No  Activities of Daily Living In your present state of health, do you have any difficulty performing the following activities?:  Driving? No Managing money?  No Feeding yourself? No Getting from bed to chair? No Climbing a flight of stairs? No Preparing food and eating?: No Bathing or showering? No Getting dressed: No Getting to the toilet? No Using the toilet:No Moving around from place to place: No In the past year have you fallen or had a near fall?:No   Are you sexually active?  No  Do you have more than one partner?  No  Hearing Difficulties: Yes Do you often ask people to speak up or repeat themselves? Yes Do you experience ringing or noises in your ears? Yes Do you have difficulty understanding soft or whispered voices? Yes   Do you feel that you have a problem with memory? No  Do you often misplace items? No  Do you feel safe at home?  Yes  Cognitive Testing  Alert? Yes  Normal Appearance?Yes  Oriented to person? Yes  Place? Yes   Time? Yes  Recall of three objects?  Yes  Can perform simple calculations? Yes  Displays appropriate judgment?Yes  Can read the correct time from a watch face?Yes   Advanced Directives have been discussed with the patient? Yes   List the Names of Other Physician/Practitioners you currently use: 1.    Indicate  any recent Medical Services you may have received from other than [facility] providers in the past year (date may be approximate).  Immunization History  Administered Date(s) Administered  . Influenza,inj,Quad PF,36+ Mos 11/05/2013  . Tdap 07/12/2013    Screening Tests Health Maintenance  Topic Date Due  . ZOSTAVAX  03/12/1989  . PNA vac Low Risk Adult (1 of 2 - PCV13) 03/12/1994  . INFLUENZA VACCINE  08/29/2014  . TETANUS/TDAP  07/13/2023    All answers were reviewed with the patient and necessary referrals were made:  Aundria Mems, MD   04/19/2014   History reviewed: allergies, current medications, past family history, past medical history, past social history, past surgical history and problem list  Review of Systems A comprehensive review of systems was negative.    Objective:     Vision by Snellen chart: right eye:see below  General: Well Developed, well nourished, and in no acute distress.  Neuro: Alert and oriented x3, extra-ocular muscles intact, sensation grossly intact. Cranial nerves II through XII are intact, motor, sensory, and coordinative functions are all intact. HEENT: Normocephalic, atraumatic, pupils equal round reactive to light, neck supple, no masses, no lymphadenopathy, thyroid nonpalpable. Oropharynx, nasopharynx, external ear canals are unremarkable.bilateral cerumen impaction, flushed and clear. Skin: Warm and dry, no rashes noted.  Cardiac: Regular rate and rhythm, no murmurs rubs or gallops.  Respiratory: Clear to auscultation bilaterally. Not using accessory muscles, speaking in full sentences.  Abdominal: Soft, nontender, nondistended, positive bowel sounds, no masses, no organomegaly.  Musculoskeletal: Shoulder, elbow, wrist, hip, knee, ankle stable, and with full range of motion.     Assessment:     Healthy male      Plan:     During the course of the visit the patient was educated and counseled about appropriate screening and  preventive services including:    Pneumococcal vaccine   Screening electrocardiogram  Prostate cancer screening  Advanced  directives: has an advanced directive - a copy has been provided  Diet review for nutrition referral? Yes ____  Not Indicated __x__   Patient Instructions (the written plan) was given to the patient.  Medicare Attestation I have personally reviewed: The patient's medical and social history Their use of alcohol, tobacco or illicit drugs Their current medications and supplements The patient's functional ability including ADLs,fall risks, home safety risks, cognitive, and hearing and visual impairment Diet and physical activities Evidence for depression or mood disorders  The patient's weight, height, BMI, and visual acuity have been recorded in the chart.  I have made referrals, counseling, and provided education to the patient based on review of the above and I have provided the patient with a written personalized care plan for preventive services.     Aundria Mems, MD   04/19/2014

## 2014-04-19 NOTE — Assessment & Plan Note (Signed)
Elevated today, return in one month to recheck.

## 2014-04-19 NOTE — Assessment & Plan Note (Signed)
Checking lipid panel. 

## 2014-04-19 NOTE — Assessment & Plan Note (Signed)
Adding doxycycline for a prolonged course. Tends to get better on Doxy and then recur with discontinuation, we're also going to discontinue all topical antibiotics.

## 2014-04-19 NOTE — Assessment & Plan Note (Signed)
12-lead EKG reviewed, normal sinus rhythm with first-degree AV block and sinus bradycardia at 57 bpm, asymptomatic, normal axis.

## 2014-04-20 DIAGNOSIS — G4733 Obstructive sleep apnea (adult) (pediatric): Secondary | ICD-10-CM | POA: Diagnosis not present

## 2014-04-20 LAB — COMPREHENSIVE METABOLIC PANEL
ALT: 16 U/L (ref 0–53)
AST: 21 U/L (ref 0–37)
Albumin: 4.4 g/dL (ref 3.5–5.2)
BUN: 15 mg/dL (ref 6–23)
CO2: 28 mEq/L (ref 19–32)
Calcium: 9.3 mg/dL (ref 8.4–10.5)
Chloride: 97 mEq/L (ref 96–112)
Potassium: 4.2 mEq/L (ref 3.5–5.3)
Total Bilirubin: 0.6 mg/dL (ref 0.2–1.2)

## 2014-04-20 LAB — CBC
HCT: 35.1 % — ABNORMAL LOW (ref 39.0–52.0)
Hemoglobin: 11.8 g/dL — ABNORMAL LOW (ref 13.0–17.0)
MCH: 29.9 pg (ref 26.0–34.0)
MCHC: 33.6 g/dL (ref 30.0–36.0)
MCV: 89.1 fL (ref 78.0–100.0)
MPV: 9.6 fL (ref 8.6–12.4)
Platelets: 278 10*3/uL (ref 150–400)
RBC: 3.94 MIL/uL — ABNORMAL LOW (ref 4.22–5.81)
RDW: 14.2 % (ref 11.5–15.5)
WBC: 6.8 K/uL (ref 4.0–10.5)

## 2014-04-20 LAB — COMPREHENSIVE METABOLIC PANEL WITH GFR
Alkaline Phosphatase: 90 U/L (ref 39–117)
Creat: 0.92 mg/dL (ref 0.50–1.35)
Glucose, Bld: 93 mg/dL (ref 70–99)
Sodium: 133 meq/L — ABNORMAL LOW (ref 135–145)
Total Protein: 6.6 g/dL (ref 6.0–8.3)

## 2014-04-20 LAB — LIPID PANEL
Cholesterol: 151 mg/dL (ref 0–200)
HDL: 37 mg/dL — ABNORMAL LOW (ref >=40)
LDL Cholesterol: 82 mg/dL (ref 0–99)
Total CHOL/HDL Ratio: 4.1 ratio
Triglycerides: 162 mg/dL — ABNORMAL HIGH (ref <150)
VLDL: 32 mg/dL (ref 0–40)

## 2014-04-20 LAB — TSH: TSH: 2.014 u[IU]/mL (ref 0.350–4.500)

## 2014-04-21 LAB — PSA, TOTAL AND FREE
PSA, Free: <0.01 ng/mL
PSA: <0.01 ng/mL (ref <=4.00)

## 2014-04-21 LAB — HEMOGLOBIN A1C
Hgb A1c MFr Bld: 5.5 % (ref <5.7)
Mean Plasma Glucose: 111 mg/dL (ref <117)

## 2014-04-28 NOTE — Addendum Note (Signed)
Addended by: Narda Rutherford on: 04/28/2014 10:08 AM   Modules accepted: Orders

## 2014-05-10 ENCOUNTER — Other Ambulatory Visit: Payer: Self-pay | Admitting: Sports Medicine

## 2014-05-15 ENCOUNTER — Other Ambulatory Visit: Payer: Self-pay | Admitting: Sports Medicine

## 2014-05-17 ENCOUNTER — Telehealth: Payer: Self-pay | Admitting: *Deleted

## 2014-05-17 ENCOUNTER — Ambulatory Visit (INDEPENDENT_AMBULATORY_CARE_PROVIDER_SITE_OTHER): Payer: Commercial Managed Care - HMO | Admitting: Family Medicine

## 2014-05-17 ENCOUNTER — Ambulatory Visit: Payer: Medicare HMO | Admitting: Sports Medicine

## 2014-05-17 VITALS — BP 155/72 | HR 65

## 2014-05-17 DIAGNOSIS — I10 Essential (primary) hypertension: Secondary | ICD-10-CM

## 2014-05-17 MED ORDER — LISINOPRIL 20 MG PO TABS: 20.0000 mg | ORAL_TABLET | Freq: Every day | ORAL | Status: DC

## 2014-05-17 NOTE — Telephone Encounter (Signed)
Pt.notified

## 2014-05-17 NOTE — Progress Notes (Signed)
   Subjective:    Patient ID: Jeffery Hart, male    DOB: 29-May-1927, 81 y.o.   MRN: [id number] Pt here today to recheck his BP.  It was elevated at 160/82 at his CPE on 3/22 with Dr. Darene Lamer.  His bp today is 155/72 pulse 65.  Beatris Ship, CMA   HPI    Review of Systems     Objective:   Physical Exam        Assessment & Plan:

## 2014-05-17 NOTE — Telephone Encounter (Signed)
-----   Message from Marcial Pacas, [place] sent at 05/17/2014  5:24 PM EDT ----- Seth Bake, Will you please let patient know that his blood pressure remains elevated to a degree that I would recommend he increase his lisinopril regimen. A new prescription of a 20 mg formulation has been sent to [facility] on [address]. I would recommend follow-up with Dr. Darene Lamer in 4 weeks.

## 2014-05-17 NOTE — Telephone Encounter (Signed)
-----   Message from Marcial Pacas, DO sent at 05/17/2014  5:24 PM EDT ----- Seth Bake, Will you please let patient know that his BP remains elevated to a degree that I would recommend increasing his lisinopril regimen. A new prescription of a 20 mg formulation to be taken daily has been sent to

## 2014-05-21 ENCOUNTER — Other Ambulatory Visit: Payer: Self-pay | Admitting: Sports Medicine

## 2014-05-21 DIAGNOSIS — G4733 Obstructive sleep apnea (adult) (pediatric): Secondary | ICD-10-CM | POA: Diagnosis not present

## 2014-05-22 ENCOUNTER — Other Ambulatory Visit: Payer: Self-pay | Admitting: Sports Medicine

## 2014-05-23 ENCOUNTER — Ambulatory Visit (INDEPENDENT_AMBULATORY_CARE_PROVIDER_SITE_OTHER): Payer: Commercial Managed Care - HMO | Admitting: Sports Medicine

## 2014-05-23 ENCOUNTER — Encounter: Payer: Self-pay | Admitting: Sports Medicine

## 2014-05-23 VITALS — BP 125/61 | HR 60 | Wt 155.0 lb

## 2014-05-23 DIAGNOSIS — H00013 Hordeolum externum right eye, unspecified eyelid: Secondary | ICD-10-CM | POA: Diagnosis not present

## 2014-05-23 DIAGNOSIS — I10 Essential (primary) hypertension: Secondary | ICD-10-CM | POA: Diagnosis not present

## 2014-05-23 NOTE — Progress Notes (Signed)
  Subjective:    CC: Follow-up  HPI: Hypertension: Well controlled now on 20 mg of lisinopril.  Stye: Resolved with prolonged course of doxycycline.  Past medical history, Surgical history, Family history not pertinant except as noted below, Social history, Allergies, and medications have been entered into the medical record, reviewed, and no changes needed.   Review of Systems: No fevers, chills, night sweats, weight loss, chest pain, or shortness of breath.   Objective:    General: Well Developed, well nourished, and in no acute distress.  Neuro: Alert and oriented x3, extra-ocular muscles intact, sensation grossly intact.  HEENT: Normocephalic, atraumatic, pupils equal round reactive to light, neck supple, no masses, no lymphadenopathy, thyroid nonpalpable.  Skin: Warm and dry, no rashes. Cardiac: Regular rate and rhythm, no murmurs rubs or gallops, no lower extremity edema.  Respiratory: Clear to auscultation bilaterally. Not using accessory muscles, speaking in full sentences.  Impression and Recommendations:

## 2014-05-23 NOTE — Assessment & Plan Note (Signed)
Well controlled on lisinopril 20, no changes.

## 2014-05-23 NOTE — Assessment & Plan Note (Signed)
Resolved

## 2014-05-24 DIAGNOSIS — G4733 Obstructive sleep apnea (adult) (pediatric): Secondary | ICD-10-CM | POA: Diagnosis not present

## 2014-05-25 ENCOUNTER — Telehealth: Payer: Self-pay | Admitting: Sports Medicine

## 2014-05-25 DIAGNOSIS — C4490 Unspecified malignant neoplasm of skin, unspecified: Secondary | ICD-10-CM

## 2014-05-25 NOTE — Telephone Encounter (Signed)
[facility] office called and adv they need a new referral for this patient. He has a 4 mnth f/up and the referral and authorization has expired. Thanks

## 2014-05-26 NOTE — Telephone Encounter (Signed)
Orders placed.

## 2014-05-27 DIAGNOSIS — Z87891 Personal history of nicotine dependence: Secondary | ICD-10-CM | POA: Diagnosis not present

## 2014-05-27 DIAGNOSIS — R569 Unspecified convulsions: Secondary | ICD-10-CM | POA: Diagnosis not present

## 2014-05-27 DIAGNOSIS — R531 Weakness: Secondary | ICD-10-CM | POA: Diagnosis not present

## 2014-05-27 DIAGNOSIS — I639 Cerebral infarction, unspecified: Secondary | ICD-10-CM | POA: Diagnosis not present

## 2014-05-27 DIAGNOSIS — I25810 Atherosclerosis of coronary artery bypass graft(s) without angina pectoris: Secondary | ICD-10-CM | POA: Diagnosis not present

## 2014-05-27 DIAGNOSIS — Z79899 Other long term (current) drug therapy: Secondary | ICD-10-CM | POA: Diagnosis not present

## 2014-05-27 DIAGNOSIS — G319 Degenerative disease of nervous system, unspecified: Secondary | ICD-10-CM | POA: Diagnosis not present

## 2014-05-27 DIAGNOSIS — I10 Essential (primary) hypertension: Secondary | ICD-10-CM | POA: Diagnosis not present

## 2014-05-27 DIAGNOSIS — C801 Malignant (primary) neoplasm, unspecified: Secondary | ICD-10-CM | POA: Diagnosis not present

## 2014-05-27 DIAGNOSIS — R404 Transient alteration of awareness: Secondary | ICD-10-CM | POA: Diagnosis not present

## 2014-05-28 DIAGNOSIS — I25810 Atherosclerosis of coronary artery bypass graft(s) without angina pectoris: Secondary | ICD-10-CM | POA: Diagnosis not present

## 2014-05-28 DIAGNOSIS — Z79899 Other long term (current) drug therapy: Secondary | ICD-10-CM | POA: Diagnosis not present

## 2014-05-28 DIAGNOSIS — Z87891 Personal history of nicotine dependence: Secondary | ICD-10-CM | POA: Diagnosis not present

## 2014-05-28 DIAGNOSIS — I639 Cerebral infarction, unspecified: Secondary | ICD-10-CM | POA: Diagnosis not present

## 2014-05-28 DIAGNOSIS — G319 Degenerative disease of nervous system, unspecified: Secondary | ICD-10-CM | POA: Diagnosis not present

## 2014-05-28 DIAGNOSIS — C801 Malignant (primary) neoplasm, unspecified: Secondary | ICD-10-CM | POA: Diagnosis not present

## 2014-05-28 DIAGNOSIS — R569 Unspecified convulsions: Secondary | ICD-10-CM | POA: Diagnosis not present

## 2014-05-28 DIAGNOSIS — I10 Essential (primary) hypertension: Secondary | ICD-10-CM | POA: Diagnosis not present

## 2014-06-06 ENCOUNTER — Other Ambulatory Visit: Payer: Self-pay | Admitting: Sports Medicine

## 2014-06-13 ENCOUNTER — Ambulatory Visit (INDEPENDENT_AMBULATORY_CARE_PROVIDER_SITE_OTHER): Payer: Commercial Managed Care - HMO | Admitting: Sports Medicine

## 2014-06-13 ENCOUNTER — Encounter: Payer: Self-pay | Admitting: Sports Medicine

## 2014-06-13 VITALS — BP 137/73 | HR 66 | Ht 63.0 in | Wt 151.0 lb

## 2014-06-13 DIAGNOSIS — Z09 Encounter for follow-up examination after completed treatment for conditions other than malignant neoplasm: Secondary | ICD-10-CM | POA: Diagnosis not present

## 2014-06-13 NOTE — Assessment & Plan Note (Signed)
Per narrative by patient and his wife it sounds as though his ER visit for a seizure, did not actually represent a seizure but was most likely a hypnagogic convulsion. He had recently had a tooth extraction, syncopized, and had some generalized jerking of his arms which stopped quickly. Electrolytes, cardiac enzymes, head CT and brain MRI were all negative in the emergency department. Because this did not represent a seizure he does not need any further workup.

## 2014-06-13 NOTE — Progress Notes (Signed)
  Subjective:    CC: Hospital follow-up  HPI: This is a pleasant 81 year old male, he has a questionable history of a seizure disorder, recently had a tooth extraction, passed out, and was seen to have some shaking activity by his wife. He was seen in the emergency department where a head CT, and an MRI were essentially negative, electrolytes were normal. On further questioning of prior seizure activity, he always syncopized first, and subsequently had some shaking of his hands but nothing described as actual tonic-clonic movements. Symptoms have completely resolved, and he is actually doing very well now.  Past medical history, Surgical history, Family history not pertinant except as noted below, Social history, Allergies, and medications have been entered into the medical record, reviewed, and no changes needed.   Review of Systems: No fevers, chills, night sweats, weight loss, chest pain, or shortness of breath.   Objective:    General: Well Developed, well nourished, and in no acute distress.  Neuro: Alert and oriented x3, extra-ocular muscles intact, sensation grossly intact.  HEENT: Normocephalic, atraumatic, pupils equal round reactive to light, neck supple, no masses, no lymphadenopathy, thyroid nonpalpable. Cranial nerves II through XII are intact, motor, sensory, and coordination functions are all intact. Skin: Warm and dry, no rashes. Cardiac: Regular rate and rhythm, no murmurs rubs or gallops, no lower extremity edema.  Respiratory: Clear to auscultation bilaterally. Not using accessory muscles, speaking in full sentences.  Impression and Recommendations:

## 2014-07-03 ENCOUNTER — Other Ambulatory Visit: Payer: Self-pay | Admitting: Sports Medicine

## 2014-07-16 ENCOUNTER — Other Ambulatory Visit: Payer: Self-pay | Admitting: Sports Medicine

## 2014-08-07 ENCOUNTER — Other Ambulatory Visit: Payer: Self-pay | Admitting: Family Medicine

## 2014-08-15 ENCOUNTER — Encounter: Payer: Self-pay | Admitting: Sports Medicine

## 2014-08-15 ENCOUNTER — Ambulatory Visit (INDEPENDENT_AMBULATORY_CARE_PROVIDER_SITE_OTHER): Payer: Commercial Managed Care - HMO | Admitting: Sports Medicine

## 2014-08-15 DIAGNOSIS — H0012 Chalazion right lower eyelid: Secondary | ICD-10-CM | POA: Diagnosis not present

## 2014-08-15 NOTE — Progress Notes (Signed)
  Subjective:    CC: follow-up  HPI: Eyelid lesion: Right lower eyelid, previously thought to be as high, this disappeared with prolonged course of doxycycline.  Past medical history, Surgical history, Family history not pertinant except as noted below, Social history, Allergies, and medications have been entered into the medical record, reviewed, and no changes needed.   Review of Systems: No fevers, chills, night sweats, weight loss, chest pain, or shortness of breath.   Objective:    General: Well Developed, well nourished, and in no acute distress.  Neuro: Alert and oriented x3, extra-ocular muscles intact, sensation grossly intact.  HEENT: Normocephalic, atraumatic, pupils equal round reactive to light, neck supple, no masses, no lymphadenopathy, thyroid nonpalpable. There is a palpable papule within the right lower eyelid. Skin: Warm and dry, no rashes. Cardiac: Regular rate and rhythm, no murmurs rubs or gallops, no lower extremity edema.  Respiratory: Clear to auscultation bilaterally. Not using accessory muscles, speaking in full sentences.  Impression and Recommendations:

## 2014-08-15 NOTE — Assessment & Plan Note (Signed)
We have done several rounds of doxy treating this as a style, as this appears to be a chalazion today, referral to ophthalmology to consider excision.

## 2014-08-30 DIAGNOSIS — H0012 Chalazion right lower eyelid: Secondary | ICD-10-CM | POA: Diagnosis not present

## 2014-09-24 ENCOUNTER — Other Ambulatory Visit: Payer: Self-pay | Admitting: Sports Medicine

## 2014-09-26 ENCOUNTER — Other Ambulatory Visit: Payer: Self-pay | Admitting: Sports Medicine

## 2014-10-11 ENCOUNTER — Telehealth: Payer: Self-pay

## 2014-10-11 DIAGNOSIS — C4490 Unspecified malignant neoplasm of skin, unspecified: Secondary | ICD-10-CM

## 2014-10-11 NOTE — Telephone Encounter (Signed)
Referral placed.

## 2014-10-11 NOTE — Telephone Encounter (Signed)
Spoke to patient spouse advised that referral was placed for [facility]. Flora Ratz,CMA

## 2014-10-11 NOTE — Telephone Encounter (Signed)
Patient wife called requested a referral for Dr. Janifer Adie @ [facility] for patient to have skin cancers on his head rechecked. Patient tried to make an appt but was told that he needed to have a new referral. Oneta Rack

## 2014-10-22 DIAGNOSIS — I25810 Atherosclerosis of coronary artery bypass graft(s) without angina pectoris: Secondary | ICD-10-CM | POA: Diagnosis not present

## 2014-10-22 DIAGNOSIS — G4730 Sleep apnea, unspecified: Secondary | ICD-10-CM | POA: Diagnosis not present

## 2014-10-22 DIAGNOSIS — Z9889 Other specified postprocedural states: Secondary | ICD-10-CM | POA: Diagnosis not present

## 2014-10-22 DIAGNOSIS — S59912A Unspecified injury of left forearm, initial encounter: Secondary | ICD-10-CM | POA: Diagnosis not present

## 2014-10-22 DIAGNOSIS — Z23 Encounter for immunization: Secondary | ICD-10-CM | POA: Diagnosis not present

## 2014-10-22 DIAGNOSIS — I10 Essential (primary) hypertension: Secondary | ICD-10-CM | POA: Diagnosis not present

## 2014-10-22 DIAGNOSIS — S51812A Laceration without foreign body of left forearm, initial encounter: Secondary | ICD-10-CM | POA: Diagnosis not present

## 2014-10-22 DIAGNOSIS — X58XXXA Exposure to other specified factors, initial encounter: Secondary | ICD-10-CM | POA: Diagnosis not present

## 2014-10-22 DIAGNOSIS — M1990 Unspecified osteoarthritis, unspecified site: Secondary | ICD-10-CM | POA: Diagnosis not present

## 2014-10-22 DIAGNOSIS — I252 Old myocardial infarction: Secondary | ICD-10-CM | POA: Diagnosis not present

## 2014-10-22 DIAGNOSIS — E785 Hyperlipidemia, unspecified: Secondary | ICD-10-CM | POA: Diagnosis not present

## 2014-10-23 DIAGNOSIS — M1990 Unspecified osteoarthritis, unspecified site: Secondary | ICD-10-CM | POA: Diagnosis not present

## 2014-10-23 DIAGNOSIS — G4730 Sleep apnea, unspecified: Secondary | ICD-10-CM | POA: Diagnosis not present

## 2014-10-23 DIAGNOSIS — I10 Essential (primary) hypertension: Secondary | ICD-10-CM | POA: Diagnosis not present

## 2014-10-23 DIAGNOSIS — E785 Hyperlipidemia, unspecified: Secondary | ICD-10-CM | POA: Diagnosis not present

## 2014-10-23 DIAGNOSIS — I252 Old myocardial infarction: Secondary | ICD-10-CM | POA: Diagnosis not present

## 2014-10-23 DIAGNOSIS — Z9889 Other specified postprocedural states: Secondary | ICD-10-CM | POA: Diagnosis not present

## 2014-10-23 DIAGNOSIS — S59912A Unspecified injury of left forearm, initial encounter: Secondary | ICD-10-CM | POA: Diagnosis not present

## 2014-10-23 DIAGNOSIS — Z23 Encounter for immunization: Secondary | ICD-10-CM | POA: Diagnosis not present

## 2014-10-23 DIAGNOSIS — S51812A Laceration without foreign body of left forearm, initial encounter: Secondary | ICD-10-CM | POA: Diagnosis not present

## 2014-10-23 DIAGNOSIS — I25810 Atherosclerosis of coronary artery bypass graft(s) without angina pectoris: Secondary | ICD-10-CM | POA: Diagnosis not present

## 2014-10-25 ENCOUNTER — Encounter: Payer: Self-pay | Admitting: Sports Medicine

## 2014-10-25 ENCOUNTER — Ambulatory Visit (INDEPENDENT_AMBULATORY_CARE_PROVIDER_SITE_OTHER): Payer: Commercial Managed Care - HMO | Admitting: Sports Medicine

## 2014-10-25 VITALS — BP 164/77 | HR 66 | Wt 140.0 lb

## 2014-10-25 DIAGNOSIS — S51812D Laceration without foreign body of left forearm, subsequent encounter: Secondary | ICD-10-CM

## 2014-10-25 DIAGNOSIS — Z23 Encounter for immunization: Secondary | ICD-10-CM | POA: Diagnosis not present

## 2014-10-25 DIAGNOSIS — S51812A Laceration without foreign body of left forearm, initial encounter: Secondary | ICD-10-CM | POA: Insufficient documentation

## 2014-10-25 NOTE — Progress Notes (Signed)
  Subjective:    CC: Follow-up laceration  HPI: 3 days ago this pleasant 81 year old male fell, causing a superficial subcutaneous laceration over his left forearm, this was repaired in the emergency department with #8 simple interrupted sutures, and he was referred back to me for further evaluation, he did receive tetanus vaccine in the emergency department. Overall he is doing well, minimal pain.  Past medical history, Surgical history, Family history not pertinant except as noted below, Social history, Allergies, and medications have been entered into the medical record, reviewed, and no changes needed.   Review of Systems: No fevers, chills, night sweats, weight loss, chest pain, or shortness of breath.   Objective:    General: Well Developed, well nourished, and in no acute distress.  Neuro: Alert and oriented x3, extra-ocular muscles intact, sensation grossly intact.  HEENT: Normocephalic, atraumatic, pupils equal round reactive to light, neck supple, no masses, no lymphadenopathy, thyroid nonpalpable.  Skin: Warm and dry, no rashes. Cardiac: Regular rate and rhythm, no murmurs rubs or gallops, no lower extremity edema.  Respiratory: Clear to auscultation bilaterally. Not using accessory muscles, speaking in full sentences. Left forearm: Dressing is removed, there is a fairly well approximated irregular appearing laceration that extends into the subcutaneous tissues without any signs of bacterial superinfection. There are 8 simple interrupted sutures counted.  The wound was cleaned, and strap with compressive dressing and a not adherent dressing.  Impression and Recommendations:

## 2014-10-25 NOTE — Assessment & Plan Note (Signed)
Occurred on 3 days ago, 8 simple interrupted sutures were placed in the emergency department, things look good today. I cleaned the wound, redressed it. Strapped the left wrist and forearm with compressive dressing.  return to see me in one week.

## 2014-10-26 DIAGNOSIS — L821 Other seborrheic keratosis: Secondary | ICD-10-CM | POA: Diagnosis not present

## 2014-10-26 DIAGNOSIS — L570 Actinic keratosis: Secondary | ICD-10-CM | POA: Diagnosis not present

## 2014-10-27 DIAGNOSIS — H35372 Puckering of macula, left eye: Secondary | ICD-10-CM | POA: Diagnosis not present

## 2014-10-27 DIAGNOSIS — H524 Presbyopia: Secondary | ICD-10-CM | POA: Diagnosis not present

## 2014-10-27 DIAGNOSIS — H5210 Myopia, unspecified eye: Secondary | ICD-10-CM | POA: Diagnosis not present

## 2014-10-31 ENCOUNTER — Other Ambulatory Visit: Payer: Self-pay | Admitting: Family Medicine

## 2014-11-01 ENCOUNTER — Encounter: Payer: Self-pay | Admitting: Sports Medicine

## 2014-11-01 ENCOUNTER — Ambulatory Visit (INDEPENDENT_AMBULATORY_CARE_PROVIDER_SITE_OTHER): Payer: Commercial Managed Care - HMO | Admitting: Sports Medicine

## 2014-11-01 VITALS — BP 173/75 | HR 57 | Wt 138.0 lb

## 2014-11-01 DIAGNOSIS — S51812D Laceration without foreign body of left forearm, subsequent encounter: Secondary | ICD-10-CM

## 2014-11-01 NOTE — Progress Notes (Signed)
  Subjective:    CC: follow-up  HPI: Tao returns, he had a left forearm laceration that was repaired in the emergency deformity, he returns today for a wound check doing very well.  Past medical history, Surgical history, Family history not pertinant except as noted below, Social history, Allergies, and medications have been entered into the medical record, reviewed, and no changes needed.   Review of Systems: No fevers, chills, night sweats, weight loss, chest pain, or shortness of breath.   Objective:    General: Well Developed, well nourished, and in no acute distress.  Neuro: Alert and oriented x3, extra-ocular muscles intact, sensation grossly intact.  HEENT: Normocephalic, atraumatic, pupils equal round reactive to light, neck supple, no masses, no lymphadenopathy, thyroid nonpalpable.  Skin: Warm and dry, no rashes. Cardiac: Regular rate and rhythm, no murmurs rubs or gallops, no lower extremity edema.  Respiratory: Clear to auscultation bilaterally. Not using accessory muscles, speaking in full sentences. Left arm: Dressings are removed, I did remove several simple interrupted proline sutures, there is an area of dehiscence in the center of the wound, otherwise looks good without any signs of bacterial superinfection, I did apply Dermabond to the entire.  Impression and Recommendations:

## 2014-11-01 NOTE — Assessment & Plan Note (Signed)
All interrupted sutures removed and skin glue applied. Return in 2 weeks.

## 2014-11-06 ENCOUNTER — Other Ambulatory Visit: Payer: Self-pay | Admitting: Sports Medicine

## 2014-11-15 ENCOUNTER — Encounter: Payer: Self-pay | Admitting: Sports Medicine

## 2014-11-15 ENCOUNTER — Ambulatory Visit (INDEPENDENT_AMBULATORY_CARE_PROVIDER_SITE_OTHER): Payer: Commercial Managed Care - HMO | Admitting: Sports Medicine

## 2014-11-15 VITALS — BP 162/55 | HR 62 | Wt 140.0 lb

## 2014-11-15 DIAGNOSIS — S51812D Laceration without foreign body of left forearm, subsequent encounter: Secondary | ICD-10-CM

## 2014-11-15 NOTE — Progress Notes (Signed)
  Subjective:    CC: follow-up  HPI: Forearm laceration: Removed sutures and placed Dermabond at the last visit, doing extremely well.  Past medical history, Surgical history, Family history not pertinant except as noted below, Social history, Allergies, and medications have been entered into the medical record, reviewed, and no changes needed.   Review of Systems: No fevers, chills, night sweats, weight loss, chest pain, or shortness of breath.   Objective:    General: Well Developed, well nourished, and in no acute distress.  Neuro: Alert and oriented x3, extra-ocular muscles intact, sensation grossly intact.  HEENT: Normocephalic, atraumatic, pupils equal round reactive to light, neck supple, no masses, no lymphadenopathy, thyroid nonpalpable.  Skin: Warm and dry, no rashes. Cardiac: Regular rate and rhythm, no murmurs rubs or gallops, no lower extremity edema.  Respiratory: Clear to auscultation bilaterally. Not using accessory muscles, speaking in full sentences. Left forearm: Dermabond is still present, the proximal half is coming off, this was removed and I was able to see a well-healed wound. No signs of bacterial superinfection, no erythema, induration. No drainage. Nontender, swelling has resolved.  Impression and Recommendations:

## 2014-11-15 NOTE — Assessment & Plan Note (Signed)
Completely resolved, Dermabond will come off in time.

## 2014-11-18 ENCOUNTER — Other Ambulatory Visit: Payer: Self-pay | Admitting: Sports Medicine

## 2014-11-20 ENCOUNTER — Other Ambulatory Visit: Payer: Self-pay | Admitting: Sports Medicine

## 2014-11-23 ENCOUNTER — Telehealth: Payer: Self-pay

## 2014-11-23 DIAGNOSIS — E222 Syndrome of inappropriate secretion of antidiuretic hormone: Secondary | ICD-10-CM | POA: Diagnosis not present

## 2014-11-23 DIAGNOSIS — I10 Essential (primary) hypertension: Secondary | ICD-10-CM | POA: Diagnosis not present

## 2014-11-23 DIAGNOSIS — I2510 Atherosclerotic heart disease of native coronary artery without angina pectoris: Secondary | ICD-10-CM | POA: Diagnosis not present

## 2014-11-23 DIAGNOSIS — Z8546 Personal history of malignant neoplasm of prostate: Secondary | ICD-10-CM

## 2014-11-23 NOTE — Telephone Encounter (Signed)
Patient stated that he went to see Dr. Welton Flakes today at [facility] and he was told that he needed a referral. So a referral was placed today. Rhonda Cunningham,CMA

## 2014-11-24 DIAGNOSIS — G4733 Obstructive sleep apnea (adult) (pediatric): Secondary | ICD-10-CM | POA: Diagnosis not present

## 2014-12-14 ENCOUNTER — Other Ambulatory Visit: Payer: Self-pay | Admitting: Sports Medicine

## 2014-12-19 ENCOUNTER — Other Ambulatory Visit: Payer: Self-pay | Admitting: Sports Medicine

## 2014-12-27 ENCOUNTER — Other Ambulatory Visit: Payer: Self-pay | Admitting: Sports Medicine

## 2014-12-27 MED ORDER — SIMVASTATIN 40 MG PO TABS: 40.0000 mg | ORAL_TABLET | Freq: Every day | ORAL | Status: DC

## 2014-12-28 ENCOUNTER — Other Ambulatory Visit: Payer: Self-pay | Admitting: Sports Medicine

## 2014-12-28 MED ORDER — SIMVASTATIN 40 MG PO TABS: 40.0000 mg | ORAL_TABLET | Freq: Every day | ORAL | Status: DC

## 2015-01-11 ENCOUNTER — Other Ambulatory Visit: Payer: Self-pay

## 2015-01-11 MED ORDER — LISINOPRIL 20 MG PO TABS: ORAL_TABLET | ORAL | Status: DC

## 2015-01-12 ENCOUNTER — Other Ambulatory Visit: Payer: Self-pay | Admitting: Sports Medicine

## 2015-01-14 ENCOUNTER — Other Ambulatory Visit: Payer: Self-pay | Admitting: Sports Medicine

## 2015-02-14 ENCOUNTER — Other Ambulatory Visit: Payer: Self-pay | Admitting: Sports Medicine

## 2015-02-17 ENCOUNTER — Other Ambulatory Visit: Payer: Self-pay | Admitting: Sports Medicine

## 2015-02-18 ENCOUNTER — Other Ambulatory Visit: Payer: Self-pay | Admitting: Sports Medicine

## 2015-02-22 ENCOUNTER — Other Ambulatory Visit: Payer: Self-pay | Admitting: Sports Medicine

## 2015-02-23 ENCOUNTER — Ambulatory Visit (INDEPENDENT_AMBULATORY_CARE_PROVIDER_SITE_OTHER): Payer: Commercial Managed Care - HMO | Admitting: Sports Medicine

## 2015-02-23 ENCOUNTER — Other Ambulatory Visit: Payer: Self-pay

## 2015-02-23 ENCOUNTER — Encounter: Payer: Self-pay | Admitting: Sports Medicine

## 2015-02-23 VITALS — BP 129/70 | HR 65 | Temp 98.1°F | Resp 18 | Wt 142.5 lb

## 2015-02-23 DIAGNOSIS — E871 Hypo-osmolality and hyponatremia: Secondary | ICD-10-CM | POA: Diagnosis not present

## 2015-02-23 DIAGNOSIS — I10 Essential (primary) hypertension: Secondary | ICD-10-CM

## 2015-02-23 DIAGNOSIS — D649 Anemia, unspecified: Secondary | ICD-10-CM | POA: Diagnosis not present

## 2015-02-23 MED ORDER — PROPRANOLOL HCL 60 MG PO TABS: 30.0000 mg | ORAL_TABLET | Freq: Two times a day (BID) | ORAL | Status: DC

## 2015-02-23 MED ORDER — FUROSEMIDE 80 MG PO TABS: ORAL_TABLET | ORAL | Status: DC

## 2015-02-23 NOTE — Progress Notes (Signed)
  Subjective:    CC: Follow-up  HPI: Jeffery Hart is a pleasant 82 year old male, he is here to discuss refills on his sodium pills, he is on several diuretics for blood pressure, and was taking sodium pills however has been off for some time, would like some routine blood work checked. Otherwise has no complaints.  Past medical history, Surgical history, Family history not pertinant except as noted below, Social history, Allergies, and medications have been entered into the medical record, reviewed, and no changes needed.   Review of Systems: No fevers, chills, night sweats, weight loss, chest pain, or shortness of breath.   Objective:    General: Well Developed, well nourished, and in no acute distress.  Neuro: Alert and oriented x3, extra-ocular muscles intact, sensation grossly intact.  HEENT: Normocephalic, atraumatic, pupils equal round reactive to light, neck supple, no masses, no lymphadenopathy, thyroid nonpalpable.  Skin: Warm and dry, no rashes. Cardiac: Regular rate and rhythm, no murmurs rubs or gallops, no lower extremity edema.  Respiratory: Clear to auscultation bilaterally. Not using accessory muscles, speaking in full sentences.  Impression and Recommendations:

## 2015-02-23 NOTE — Assessment & Plan Note (Signed)
Continue current medications, recheck blood work, he wonders if he needs to repeat start his sodium pills.

## 2015-02-24 ENCOUNTER — Other Ambulatory Visit: Payer: Self-pay

## 2015-02-24 DIAGNOSIS — E871 Hypo-osmolality and hyponatremia: Secondary | ICD-10-CM | POA: Insufficient documentation

## 2015-02-24 LAB — COMPREHENSIVE METABOLIC PANEL WITH GFR
ALT: 15 U/L (ref 9–46)
Albumin: 4 g/dL (ref 3.6–5.1)
Alkaline Phosphatase: 69 U/L (ref 40–115)
Calcium: 8.8 mg/dL (ref 8.6–10.3)
Glucose, Bld: 114 mg/dL — ABNORMAL HIGH (ref 65–99)
Potassium: 4.1 mmol/L (ref 3.5–5.3)
Total Bilirubin: 0.4 mg/dL (ref 0.2–1.2)
Total Protein: 6.2 g/dL (ref 6.1–8.1)

## 2015-02-24 LAB — CBC
HCT: 31.3 % — ABNORMAL LOW (ref 39.0–52.0)
Hemoglobin: 10.2 g/dL — ABNORMAL LOW (ref 13.0–17.0)
MCH: 29.1 pg (ref 26.0–34.0)
MCHC: 32.6 g/dL (ref 30.0–36.0)
MCV: 89.4 fL (ref 78.0–100.0)
MPV: 10.1 fL (ref 8.6–12.4)
Platelets: 272 10*3/uL (ref 150–400)
RBC: 3.5 MIL/uL — ABNORMAL LOW (ref 4.22–5.81)
RDW: 13.9 % (ref 11.5–15.5)
WBC: 5.9 10*3/uL (ref 4.0–10.5)

## 2015-02-24 LAB — FERRITIN: Ferritin: 505 ng/mL — ABNORMAL HIGH (ref 22–322)

## 2015-02-24 LAB — COMPREHENSIVE METABOLIC PANEL
AST: 19 U/L (ref 10–35)
BUN: 25 mg/dL (ref 7–25)
CO2: 29 mmol/L (ref 20–31)
Chloride: 96 mmol/L — ABNORMAL LOW (ref 98–110)
Creat: 1.08 mg/dL (ref 0.70–1.11)
Sodium: 133 mmol/L — ABNORMAL LOW (ref 135–146)

## 2015-02-24 MED ORDER — SODIUM CHLORIDE 1 G PO TABS: ORAL_TABLET | ORAL | Status: DC

## 2015-02-24 MED ORDER — CLOPIDOGREL BISULFATE 75 MG PO TABS: 75.0000 mg | ORAL_TABLET | Freq: Every day | ORAL | Status: DC

## 2015-02-24 NOTE — Addendum Note (Signed)
Addended by: Silverio Decamp on: 02/24/2015 09:09 AM   Modules accepted: Orders

## 2015-02-24 NOTE — Assessment & Plan Note (Signed)
Refilling sodium chloride pills, recheck in 2 weeks.

## 2015-03-23 ENCOUNTER — Ambulatory Visit (INDEPENDENT_AMBULATORY_CARE_PROVIDER_SITE_OTHER): Payer: Commercial Managed Care - HMO | Admitting: Sports Medicine

## 2015-03-23 VITALS — BP 160/72 | HR 64 | Resp 18 | Wt 144.7 lb

## 2015-03-23 DIAGNOSIS — I10 Essential (primary) hypertension: Secondary | ICD-10-CM | POA: Diagnosis not present

## 2015-03-23 DIAGNOSIS — E871 Hypo-osmolality and hyponatremia: Secondary | ICD-10-CM

## 2015-03-23 DIAGNOSIS — M1711 Unilateral primary osteoarthritis, right knee: Secondary | ICD-10-CM

## 2015-03-23 DIAGNOSIS — M25512 Pain in left shoulder: Secondary | ICD-10-CM | POA: Diagnosis not present

## 2015-03-23 MED ORDER — CARVEDILOL 12.5 MG PO TABS: 12.5000 mg | ORAL_TABLET | Freq: Two times a day (BID) | ORAL | Status: DC

## 2015-03-23 NOTE — Assessment & Plan Note (Signed)
Continue sodium pills, we are checking electrolytes

## 2015-03-23 NOTE — Assessment & Plan Note (Signed)
Switching from propranolol to carvedilol for improved blood pressure control

## 2015-03-23 NOTE — Assessment & Plan Note (Signed)
Subacromial injection, it's been almost 2 years since his previous injection

## 2015-03-23 NOTE — Assessment & Plan Note (Signed)
Repeat injection today.

## 2015-03-23 NOTE — Progress Notes (Signed)
  Subjective:    CC: Follow-up  HPI: Hyponatremia: Ready to recheck sodium, has been on sodium pills for 4 weeks now.  Hypertension: Uncontrolled, continues with lisinopril, furosemide, propranolol.  Coronary artery disease: Post coronary bypass, no chest pain on exertion.  Right knee osteoarthritis: Desires injection, pain is moderate, persistent and localized at the joint lines.  Left shoulder pain: Combination of subacromial bursitis and glenohumeral osteoarthritis, subacromial injection is desired, the previous one was almost 2 years ago.  Past medical history, Surgical history, Family history not pertinant except as noted below, Social history, Allergies, and medications have been entered into the medical record, reviewed, and no changes needed.   Review of Systems: No fevers, chills, night sweats, weight loss, chest pain, or shortness of breath.   Objective:    General: Well Developed, well nourished, and in no acute distress.  Neuro: Alert and oriented x3, extra-ocular muscles intact, sensation grossly intact.  HEENT: Normocephalic, atraumatic, pupils equal round reactive to light, neck supple, no masses, no lymphadenopathy, thyroid nonpalpable.  Skin: Warm and dry, no rashes. Cardiac: Regular rate and rhythm, no murmurs rubs or gallops, no lower extremity edema.  Respiratory: Clear to auscultation bilaterally. Not using accessory muscles, speaking in full sentences.  Procedure: Real-time Ultrasound Guided Injection of left subacromial bursa Device: GE Logiq E  Verbal informed consent obtained.  Time-out conducted.  Noted no overlying erythema, induration, or other signs of local infection.  Skin prepped in a sterile fashion.  Local anesthesia: Topical Ethyl chloride.  With sterile technique and under real time ultrasound guidance:  1 mL kenalog 40, 2 mL lidocaine, 2 mL Marcaine injected easily Completed without difficulty  Pain immediately resolved suggesting accurate  placement of the medication.  Advised to call if fevers/chills, erythema, induration, drainage, or persistent bleeding.  Images permanently stored and available for review in the ultrasound unit.  Impression: Technically successful ultrasound guided injection.  Procedure: Real-time Ultrasound Guided Injection of right knee Device: GE Logiq E  Verbal informed consent obtained.  Time-out conducted.  Noted no overlying erythema, induration, or other signs of local infection.  Skin prepped in a sterile fashion.  Local anesthesia: Topical Ethyl chloride.  With sterile technique and under real time ultrasound guidance:  1 mL kenalog 40, 2 mL lidocaine, 2 mL Marcaine injected easily Completed without difficulty  Pain immediately resolved suggesting accurate placement of the medication.  Advised to call if fevers/chills, erythema, induration, drainage, or persistent bleeding.  Images permanently stored and available for review in the ultrasound unit.  Impression: Technically successful ultrasound guided injection.  Impression and Recommendations:

## 2015-03-24 LAB — BASIC METABOLIC PANEL WITH GFR
Chloride: 96 mmol/L — ABNORMAL LOW (ref 98–110)
Creat: 1.21 mg/dL — ABNORMAL HIGH (ref 0.70–1.11)

## 2015-03-24 LAB — BASIC METABOLIC PANEL
BUN: 22 mg/dL (ref 7–25)
CO2: 29 mmol/L (ref 20–31)
Calcium: 9 mg/dL (ref 8.6–10.3)
Glucose, Bld: 91 mg/dL (ref 65–99)
Potassium: 4.3 mmol/L (ref 3.5–5.3)
Sodium: 135 mmol/L (ref 135–146)

## 2015-04-06 ENCOUNTER — Ambulatory Visit (INDEPENDENT_AMBULATORY_CARE_PROVIDER_SITE_OTHER): Payer: Commercial Managed Care - HMO | Admitting: Sports Medicine

## 2015-04-06 DIAGNOSIS — I10 Essential (primary) hypertension: Secondary | ICD-10-CM

## 2015-04-06 MED ORDER — LISINOPRIL 40 MG PO TABS: 40.0000 mg | ORAL_TABLET | Freq: Every day | ORAL | Status: DC

## 2015-04-06 MED ORDER — CARVEDILOL 25 MG PO TABS: 25.0000 mg | ORAL_TABLET | Freq: Two times a day (BID) | ORAL | Status: DC

## 2015-04-06 NOTE — Progress Notes (Signed)
  Subjective:    CC: Follow-up  HPI: Hypertension: Still elevated despite switching from propranolol to carvedilol, no headaches or visual changes, chest pain  Past medical history, Surgical history, Family history not pertinant except as noted below, Social history, Allergies, and medications have been entered into the medical record, reviewed, and no changes needed.   Review of Systems: No fevers, chills, night sweats, weight loss, chest pain, or shortness of breath.   Objective:    General: Well Developed, well nourished, and in no acute distress.  Neuro: Alert and oriented x3, extra-ocular muscles intact, sensation grossly intact.  HEENT: Normocephalic, atraumatic, pupils equal round reactive to light, neck supple, no masses, no lymphadenopathy, thyroid nonpalpable.  Skin: Warm and dry, no rashes. Cardiac: Regular rate and rhythm, no murmurs rubs or gallops, no lower extremity edema.  Respiratory: Clear to auscultation bilaterally. Not using accessory muscles, speaking in full sentences.  Impression and Recommendations:

## 2015-04-06 NOTE — Assessment & Plan Note (Signed)
Still elevated,increasing lisinopril to 40 mg daily and carvedilol to 25 mg daily

## 2015-04-20 ENCOUNTER — Ambulatory Visit (INDEPENDENT_AMBULATORY_CARE_PROVIDER_SITE_OTHER): Payer: Commercial Managed Care - HMO | Admitting: Sports Medicine

## 2015-04-20 VITALS — BP 122/64 | HR 66 | Resp 18 | Wt 143.6 lb

## 2015-04-20 DIAGNOSIS — I10 Essential (primary) hypertension: Secondary | ICD-10-CM | POA: Diagnosis not present

## 2015-04-20 DIAGNOSIS — Z0000 Encounter for general adult medical examination without abnormal findings: Secondary | ICD-10-CM | POA: Diagnosis not present

## 2015-04-20 MED ORDER — ZOSTER VACCINE LIVE 19400 UNT/0.65ML ~~LOC~~ SOLR: 0.6500 mL | Freq: Once | SUBCUTANEOUS | Status: DC

## 2015-04-20 NOTE — Assessment & Plan Note (Signed)
Well-controlled now. No changes.

## 2015-04-20 NOTE — Assessment & Plan Note (Signed)
Due for Zostavax.

## 2015-04-20 NOTE — Progress Notes (Signed)
  Subjective:    CC: follow-up  HPI: Hypertension: Well controlled on current medications now  Preventative measures: Due for single vaccine  Past medical history, Surgical history, Family history not pertinant except as noted below, Social history, Allergies, and medications have been entered into the medical record, reviewed, and no changes needed.   Review of Systems: No fevers, chills, night sweats, weight loss, chest pain, or shortness of breath.   Objective:    General: Well Developed, well nourished, and in no acute distress.  Neuro: Alert and oriented x3, extra-ocular muscles intact, sensation grossly intact.  HEENT: Normocephalic, atraumatic, pupils equal round reactive to light, neck supple, no masses, no lymphadenopathy, thyroid nonpalpable.  Skin: Warm and dry, no rashes. Cardiac: Regular rate and rhythm, no murmurs rubs or gallops, no lower extremity edema.  Respiratory: Clear to auscultation bilaterally. Not using accessory muscles, speaking in full sentences.  Impression and Recommendations:

## 2015-05-09 ENCOUNTER — Other Ambulatory Visit: Payer: Self-pay | Admitting: Sports Medicine

## 2015-05-10 ENCOUNTER — Other Ambulatory Visit: Payer: Self-pay | Admitting: Sports Medicine

## 2015-05-14 ENCOUNTER — Other Ambulatory Visit: Payer: Self-pay | Admitting: Sports Medicine

## 2015-05-14 DIAGNOSIS — R55 Syncope and collapse: Secondary | ICD-10-CM | POA: Diagnosis not present

## 2015-05-14 DIAGNOSIS — E222 Syndrome of inappropriate secretion of antidiuretic hormone: Secondary | ICD-10-CM | POA: Diagnosis not present

## 2015-05-14 DIAGNOSIS — R110 Nausea: Secondary | ICD-10-CM | POA: Diagnosis not present

## 2015-05-14 DIAGNOSIS — E785 Hyperlipidemia, unspecified: Secondary | ICD-10-CM | POA: Diagnosis not present

## 2015-05-14 DIAGNOSIS — Z951 Presence of aortocoronary bypass graft: Secondary | ICD-10-CM | POA: Diagnosis not present

## 2015-05-14 DIAGNOSIS — R531 Weakness: Secondary | ICD-10-CM | POA: Diagnosis not present

## 2015-05-14 DIAGNOSIS — R404 Transient alteration of awareness: Secondary | ICD-10-CM | POA: Diagnosis not present

## 2015-05-14 DIAGNOSIS — R748 Abnormal levels of other serum enzymes: Secondary | ICD-10-CM | POA: Diagnosis not present

## 2015-05-14 DIAGNOSIS — G4733 Obstructive sleep apnea (adult) (pediatric): Secondary | ICD-10-CM | POA: Diagnosis not present

## 2015-05-14 DIAGNOSIS — I2510 Atherosclerotic heart disease of native coronary artery without angina pectoris: Secondary | ICD-10-CM | POA: Diagnosis not present

## 2015-05-14 DIAGNOSIS — Z79899 Other long term (current) drug therapy: Secondary | ICD-10-CM | POA: Diagnosis not present

## 2015-05-14 DIAGNOSIS — D649 Anemia, unspecified: Secondary | ICD-10-CM | POA: Diagnosis not present

## 2015-05-14 DIAGNOSIS — Z9989 Dependence on other enabling machines and devices: Secondary | ICD-10-CM | POA: Diagnosis not present

## 2015-05-14 DIAGNOSIS — I10 Essential (primary) hypertension: Secondary | ICD-10-CM | POA: Diagnosis not present

## 2015-05-14 DIAGNOSIS — R42 Dizziness and giddiness: Secondary | ICD-10-CM | POA: Diagnosis not present

## 2015-05-15 DIAGNOSIS — Z951 Presence of aortocoronary bypass graft: Secondary | ICD-10-CM | POA: Diagnosis not present

## 2015-05-15 DIAGNOSIS — E222 Syndrome of inappropriate secretion of antidiuretic hormone: Secondary | ICD-10-CM | POA: Diagnosis not present

## 2015-05-15 DIAGNOSIS — I517 Cardiomegaly: Secondary | ICD-10-CM | POA: Diagnosis not present

## 2015-05-15 DIAGNOSIS — Z9989 Dependence on other enabling machines and devices: Secondary | ICD-10-CM | POA: Diagnosis not present

## 2015-05-15 DIAGNOSIS — I10 Essential (primary) hypertension: Secondary | ICD-10-CM | POA: Diagnosis not present

## 2015-05-15 DIAGNOSIS — E7800 Pure hypercholesterolemia, unspecified: Secondary | ICD-10-CM | POA: Diagnosis not present

## 2015-05-15 DIAGNOSIS — I519 Heart disease, unspecified: Secondary | ICD-10-CM | POA: Diagnosis not present

## 2015-05-15 DIAGNOSIS — I2510 Atherosclerotic heart disease of native coronary artery without angina pectoris: Secondary | ICD-10-CM | POA: Diagnosis not present

## 2015-05-15 DIAGNOSIS — Z8249 Family history of ischemic heart disease and other diseases of the circulatory system: Secondary | ICD-10-CM | POA: Diagnosis not present

## 2015-05-15 DIAGNOSIS — I080 Rheumatic disorders of both mitral and aortic valves: Secondary | ICD-10-CM | POA: Diagnosis not present

## 2015-05-15 DIAGNOSIS — G4733 Obstructive sleep apnea (adult) (pediatric): Secondary | ICD-10-CM | POA: Diagnosis not present

## 2015-05-15 DIAGNOSIS — R748 Abnormal levels of other serum enzymes: Secondary | ICD-10-CM | POA: Diagnosis not present

## 2015-05-15 DIAGNOSIS — R55 Syncope and collapse: Secondary | ICD-10-CM | POA: Diagnosis not present

## 2015-05-18 ENCOUNTER — Telehealth: Payer: Self-pay | Admitting: Sports Medicine

## 2015-05-18 MED ORDER — AMBULATORY NON FORMULARY MEDICATION: Status: AC

## 2015-05-18 NOTE — Telephone Encounter (Signed)
Jeffery Hart had another episode of syncope when going from sitting to standing, he felt somewhat faint, no chest pain, no palpitations, bystanders said that he looked somewhat pale. He then passed out, he was attended to by physician from their congregation, who also suspected orthostatic hypotension. I am going to add a higher pressure lower extremity compression hose, we are also going to hold his Lasix for now, he does have an appointment coming up with cardiology

## 2015-05-19 DIAGNOSIS — I2510 Atherosclerotic heart disease of native coronary artery without angina pectoris: Secondary | ICD-10-CM | POA: Diagnosis not present

## 2015-05-19 DIAGNOSIS — D649 Anemia, unspecified: Secondary | ICD-10-CM | POA: Diagnosis not present

## 2015-05-19 DIAGNOSIS — I10 Essential (primary) hypertension: Secondary | ICD-10-CM | POA: Diagnosis not present

## 2015-05-19 DIAGNOSIS — R55 Syncope and collapse: Secondary | ICD-10-CM | POA: Diagnosis not present

## 2015-05-19 DIAGNOSIS — Z951 Presence of aortocoronary bypass graft: Secondary | ICD-10-CM | POA: Diagnosis not present

## 2015-05-21 DIAGNOSIS — Z951 Presence of aortocoronary bypass graft: Secondary | ICD-10-CM | POA: Diagnosis not present

## 2015-05-21 DIAGNOSIS — R55 Syncope and collapse: Secondary | ICD-10-CM | POA: Diagnosis not present

## 2015-05-21 DIAGNOSIS — I2510 Atherosclerotic heart disease of native coronary artery without angina pectoris: Secondary | ICD-10-CM | POA: Diagnosis not present

## 2015-05-21 DIAGNOSIS — I10 Essential (primary) hypertension: Secondary | ICD-10-CM | POA: Diagnosis not present

## 2015-05-21 DIAGNOSIS — D649 Anemia, unspecified: Secondary | ICD-10-CM | POA: Diagnosis not present

## 2015-05-22 ENCOUNTER — Ambulatory Visit (INDEPENDENT_AMBULATORY_CARE_PROVIDER_SITE_OTHER): Payer: Commercial Managed Care - HMO | Admitting: Sports Medicine

## 2015-05-22 VITALS — BP 156/74 | HR 56 | Resp 18 | Wt 141.6 lb

## 2015-05-22 DIAGNOSIS — D539 Nutritional anemia, unspecified: Secondary | ICD-10-CM | POA: Diagnosis not present

## 2015-05-22 DIAGNOSIS — I10 Essential (primary) hypertension: Secondary | ICD-10-CM

## 2015-05-22 DIAGNOSIS — D649 Anemia, unspecified: Secondary | ICD-10-CM | POA: Insufficient documentation

## 2015-05-22 DIAGNOSIS — I2583 Coronary atherosclerosis due to lipid rich plaque: Secondary | ICD-10-CM

## 2015-05-22 DIAGNOSIS — I2510 Atherosclerotic heart disease of native coronary artery without angina pectoris: Secondary | ICD-10-CM

## 2015-05-22 LAB — CBC
HCT: 30.6 % — ABNORMAL LOW (ref 38.5–50.0)
Hemoglobin: 10.3 g/dL — ABNORMAL LOW (ref 13.2–17.1)
MCH: 30.1 pg (ref 27.0–33.0)
MCHC: 33.7 g/dL (ref 32.0–36.0)
MCV: 89.5 fL (ref 80.0–100.0)
MPV: 10 fL (ref 7.5–12.5)
Platelets: 205 K/uL (ref 140–400)
RBC: 3.42 MIL/uL — ABNORMAL LOW (ref 4.20–5.80)
RDW: 13.6 % (ref 11.0–15.0)
WBC: 7.1 10*3/uL (ref 3.8–10.8)

## 2015-05-22 LAB — RETICULOCYTES
ABS Retic: 20520 {cells}/uL — ABNORMAL LOW (ref 25000–90000)
RBC.: 3.42 MIL/uL — ABNORMAL LOW (ref 4.20–5.80)
Retic Ct Pct: 0.6 %

## 2015-05-22 LAB — FECAL OCCULT BLOOD, GUAIAC: Fecal Occult Blood: POSITIVE

## 2015-05-22 NOTE — Assessment & Plan Note (Signed)
Syncopal symptoms likely represent orthostatic hypotension, he was found to be anemic with a hemoglobin in the nines, and positive Hemoccult today. Iron studies appeared normal with the exception of elevated ferritin in the hospital. Repeating anemia panel, and adding a serum protein electrophoresis, as well as referral for upper and lower endoscopy.

## 2015-05-22 NOTE — Assessment & Plan Note (Signed)
Did have an elevation in troponins in the hospital, was seen by cardiology, nuclear stress test was negative with the exception of an all the area of ischemia in the apex. No further intervention was recommended by cardiology.

## 2015-05-22 NOTE — Assessment & Plan Note (Signed)
Holding off on Lasix for now.

## 2015-05-22 NOTE — Progress Notes (Signed)
  Subjective:    CC: Hospital follow-up  HPI: Was at church over [place], stood up, felt somewhat dizzy, hot, and became syncopal. He was seen by a consultation member who is a physician, and suspected orthostatic hypotension. He was then seen in the hospital, hemoglobin was noted approximately 9, renal function was okay. He did have a small bump in his troponins, subsequent nuclear stress test was negative and he was seen by his cardiologist in the hospital. He denied any chest pain or palpitations during the episode. Overall he feels better today.  Past medical history, Surgical history, Family history not pertinant except as noted below, Social history, Allergies, and medications have been entered into the medical record, reviewed, and no changes needed.   Review of Systems: No fevers, chills, night sweats, weight loss, chest pain, or shortness of breath.   Objective:    General: Well Developed, well nourished, and in no acute distress.  Neuro: Alert and oriented x3, extra-ocular muscles intact, sensation grossly intact.  HEENT: Normocephalic, atraumatic, pupils equal round reactive to light, neck supple, no masses, no lymphadenopathy, thyroid nonpalpable.  Skin: Warm and dry, no rashes. Cardiac: Regular rate and rhythm, no murmurs rubs or gallops, no lower extremity edema.  Respiratory: Clear to auscultation bilaterally. Not using accessory muscles, speaking in full sentences. Rectal: Good tone, smooth prostate, Hemoccult positive.  Impression and Recommendations:    I spent 25 minutes with this patient, greater than 50% was face-to-face time counseling regarding the above diagnoses

## 2015-05-23 DIAGNOSIS — R55 Syncope and collapse: Secondary | ICD-10-CM | POA: Diagnosis not present

## 2015-05-23 DIAGNOSIS — I10 Essential (primary) hypertension: Secondary | ICD-10-CM | POA: Diagnosis not present

## 2015-05-23 DIAGNOSIS — I2510 Atherosclerotic heart disease of native coronary artery without angina pectoris: Secondary | ICD-10-CM | POA: Diagnosis not present

## 2015-05-23 DIAGNOSIS — Z951 Presence of aortocoronary bypass graft: Secondary | ICD-10-CM | POA: Diagnosis not present

## 2015-05-23 DIAGNOSIS — D649 Anemia, unspecified: Secondary | ICD-10-CM | POA: Diagnosis not present

## 2015-05-23 LAB — IRON AND TIBC
%SAT: 12 % — ABNORMAL LOW (ref 15–60)
Iron: 33 ug/dL — ABNORMAL LOW (ref 50–180)
TIBC: 279 ug/dL (ref 250–425)
UIBC: 246 ug/dL (ref 125–400)

## 2015-05-23 LAB — PATHOLOGIST SMEAR REVIEW

## 2015-05-23 LAB — FERRITIN: Ferritin: 488 ng/mL — ABNORMAL HIGH (ref 20–380)

## 2015-05-23 LAB — VITAMIN B12: Vitamin B-12: 814 pg/mL (ref 200–1100)

## 2015-05-23 LAB — FOLATE: Folate: 22.1 ng/mL (ref >5.4)

## 2015-05-24 DIAGNOSIS — I2510 Atherosclerotic heart disease of native coronary artery without angina pectoris: Secondary | ICD-10-CM | POA: Diagnosis not present

## 2015-05-24 DIAGNOSIS — Z951 Presence of aortocoronary bypass graft: Secondary | ICD-10-CM | POA: Diagnosis not present

## 2015-05-24 DIAGNOSIS — D649 Anemia, unspecified: Secondary | ICD-10-CM | POA: Diagnosis not present

## 2015-05-24 DIAGNOSIS — I10 Essential (primary) hypertension: Secondary | ICD-10-CM | POA: Diagnosis not present

## 2015-05-24 DIAGNOSIS — R55 Syncope and collapse: Secondary | ICD-10-CM | POA: Diagnosis not present

## 2015-05-25 DIAGNOSIS — G4733 Obstructive sleep apnea (adult) (pediatric): Secondary | ICD-10-CM | POA: Diagnosis not present

## 2015-05-25 DIAGNOSIS — I10 Essential (primary) hypertension: Secondary | ICD-10-CM | POA: Diagnosis not present

## 2015-05-25 DIAGNOSIS — Z951 Presence of aortocoronary bypass graft: Secondary | ICD-10-CM | POA: Diagnosis not present

## 2015-05-25 DIAGNOSIS — R55 Syncope and collapse: Secondary | ICD-10-CM | POA: Diagnosis not present

## 2015-05-25 DIAGNOSIS — I2510 Atherosclerotic heart disease of native coronary artery without angina pectoris: Secondary | ICD-10-CM | POA: Diagnosis not present

## 2015-05-25 DIAGNOSIS — D649 Anemia, unspecified: Secondary | ICD-10-CM | POA: Diagnosis not present

## 2015-05-25 LAB — PROTEIN ELECTROPHORESIS, SERUM
Albumin ELP: 3.8 g/dL (ref 3.8–4.8)
Alpha-1-Globulin: 0.3 g/dL (ref 0.2–0.3)
Alpha-2-Globulin: 0.8 g/dL (ref 0.5–0.9)
Beta 2: 0.3 g/dL (ref 0.2–0.5)
Beta Globulin: 0.4 g/dL (ref 0.4–0.6)
Gamma Globulin: 0.6 g/dL — ABNORMAL LOW (ref 0.8–1.7)
Total Protein, Serum Electrophoresis: 6.1 g/dL (ref 6.1–8.1)

## 2015-05-30 DIAGNOSIS — Z951 Presence of aortocoronary bypass graft: Secondary | ICD-10-CM | POA: Diagnosis not present

## 2015-05-30 DIAGNOSIS — I10 Essential (primary) hypertension: Secondary | ICD-10-CM | POA: Diagnosis not present

## 2015-05-30 DIAGNOSIS — R55 Syncope and collapse: Secondary | ICD-10-CM | POA: Diagnosis not present

## 2015-05-30 DIAGNOSIS — D649 Anemia, unspecified: Secondary | ICD-10-CM | POA: Diagnosis not present

## 2015-05-30 DIAGNOSIS — I2583 Coronary atherosclerosis due to lipid rich plaque: Secondary | ICD-10-CM | POA: Diagnosis not present

## 2015-05-30 DIAGNOSIS — I2510 Atherosclerotic heart disease of native coronary artery without angina pectoris: Secondary | ICD-10-CM | POA: Diagnosis not present

## 2015-05-31 DIAGNOSIS — D649 Anemia, unspecified: Secondary | ICD-10-CM | POA: Diagnosis not present

## 2015-05-31 DIAGNOSIS — R195 Other fecal abnormalities: Secondary | ICD-10-CM | POA: Diagnosis not present

## 2015-06-01 DIAGNOSIS — R55 Syncope and collapse: Secondary | ICD-10-CM | POA: Diagnosis not present

## 2015-06-01 DIAGNOSIS — I2510 Atherosclerotic heart disease of native coronary artery without angina pectoris: Secondary | ICD-10-CM | POA: Diagnosis not present

## 2015-06-01 DIAGNOSIS — D649 Anemia, unspecified: Secondary | ICD-10-CM | POA: Diagnosis not present

## 2015-06-01 DIAGNOSIS — Z951 Presence of aortocoronary bypass graft: Secondary | ICD-10-CM | POA: Diagnosis not present

## 2015-06-01 DIAGNOSIS — I10 Essential (primary) hypertension: Secondary | ICD-10-CM | POA: Diagnosis not present

## 2015-06-06 DIAGNOSIS — I10 Essential (primary) hypertension: Secondary | ICD-10-CM | POA: Diagnosis not present

## 2015-06-06 DIAGNOSIS — D649 Anemia, unspecified: Secondary | ICD-10-CM | POA: Diagnosis not present

## 2015-06-06 DIAGNOSIS — R55 Syncope and collapse: Secondary | ICD-10-CM | POA: Diagnosis not present

## 2015-06-06 DIAGNOSIS — I2510 Atherosclerotic heart disease of native coronary artery without angina pectoris: Secondary | ICD-10-CM | POA: Diagnosis not present

## 2015-06-06 DIAGNOSIS — Z951 Presence of aortocoronary bypass graft: Secondary | ICD-10-CM | POA: Diagnosis not present

## 2015-06-08 ENCOUNTER — Telehealth: Payer: Self-pay | Admitting: *Deleted

## 2015-06-08 DIAGNOSIS — I10 Essential (primary) hypertension: Secondary | ICD-10-CM

## 2015-06-08 MED ORDER — CARVEDILOL 25 MG PO TABS: 25.0000 mg | ORAL_TABLET | Freq: Two times a day (BID) | ORAL | Status: DC

## 2015-06-08 NOTE — Telephone Encounter (Signed)
Pharmacy sent a request to write carvedilol 25 mg tablet for 90 day supply

## 2015-06-13 DIAGNOSIS — D649 Anemia, unspecified: Secondary | ICD-10-CM | POA: Diagnosis not present

## 2015-06-13 DIAGNOSIS — K219 Gastro-esophageal reflux disease without esophagitis: Secondary | ICD-10-CM | POA: Diagnosis not present

## 2015-06-13 DIAGNOSIS — I10 Essential (primary) hypertension: Secondary | ICD-10-CM | POA: Diagnosis not present

## 2015-06-13 DIAGNOSIS — Z951 Presence of aortocoronary bypass graft: Secondary | ICD-10-CM | POA: Diagnosis not present

## 2015-06-13 DIAGNOSIS — R55 Syncope and collapse: Secondary | ICD-10-CM | POA: Diagnosis not present

## 2015-06-13 DIAGNOSIS — I2510 Atherosclerotic heart disease of native coronary artery without angina pectoris: Secondary | ICD-10-CM | POA: Diagnosis not present

## 2015-06-13 DIAGNOSIS — K224 Dyskinesia of esophagus: Secondary | ICD-10-CM | POA: Diagnosis not present

## 2015-06-13 DIAGNOSIS — K449 Diaphragmatic hernia without obstruction or gangrene: Secondary | ICD-10-CM | POA: Diagnosis not present

## 2015-06-15 DIAGNOSIS — I10 Essential (primary) hypertension: Secondary | ICD-10-CM | POA: Diagnosis not present

## 2015-06-15 DIAGNOSIS — D649 Anemia, unspecified: Secondary | ICD-10-CM | POA: Diagnosis not present

## 2015-06-15 DIAGNOSIS — R55 Syncope and collapse: Secondary | ICD-10-CM | POA: Diagnosis not present

## 2015-06-15 DIAGNOSIS — I2510 Atherosclerotic heart disease of native coronary artery without angina pectoris: Secondary | ICD-10-CM | POA: Diagnosis not present

## 2015-06-15 DIAGNOSIS — Z951 Presence of aortocoronary bypass graft: Secondary | ICD-10-CM | POA: Diagnosis not present

## 2015-06-16 ENCOUNTER — Telehealth: Payer: Self-pay | Admitting: Sports Medicine

## 2015-06-16 ENCOUNTER — Other Ambulatory Visit: Payer: Self-pay

## 2015-06-16 DIAGNOSIS — I10 Essential (primary) hypertension: Secondary | ICD-10-CM

## 2015-06-16 DIAGNOSIS — M1711 Unilateral primary osteoarthritis, right knee: Secondary | ICD-10-CM

## 2015-06-16 MED ORDER — CARVEDILOL 25 MG PO TABS: 25.0000 mg | ORAL_TABLET | Freq: Two times a day (BID) | ORAL | Status: DC

## 2015-06-16 NOTE — Telephone Encounter (Signed)
-----   Message from Silverio Decamp, MD sent at 06/16/2015  3:21 PM EDT ----- Orthovisc right knee please.  XR confirmed, failed everything. ___________________________________________ Gwen Her. Dianah Field, M.D., ABFM., CAQSM. Primary Care and [place] Instructor of [employer] [facility]

## 2015-06-16 NOTE — Telephone Encounter (Signed)
Submitted for approval on Orthovisc. Awaiting confirmation.  

## 2015-06-19 ENCOUNTER — Encounter: Payer: Self-pay | Admitting: Sports Medicine

## 2015-06-19 ENCOUNTER — Ambulatory Visit (INDEPENDENT_AMBULATORY_CARE_PROVIDER_SITE_OTHER): Payer: Commercial Managed Care - HMO | Admitting: Sports Medicine

## 2015-06-19 DIAGNOSIS — D649 Anemia, unspecified: Secondary | ICD-10-CM

## 2015-06-19 MED ORDER — FERROUS SULFATE 325 (65 FE) MG PO TBEC: 325.0000 mg | DELAYED_RELEASE_TABLET | Freq: Two times a day (BID) | ORAL | Status: DC

## 2015-06-19 MED ORDER — DOCUSATE SODIUM 100 MG PO CAPS: 100.0000 mg | ORAL_CAPSULE | Freq: Three times a day (TID) | ORAL | Status: DC | PRN

## 2015-06-19 NOTE — Progress Notes (Signed)
  Subjective:    CC:  Follow-up  HPI: Iron deficiency anemia:did touch base with gastroenterologist, the plan is for ColoGuard rather than colonoscopy, they did start him on omeprazole and iro once a day. He is having some associated constipation.  Past medical history, Surgical history, Family history not pertinant except as noted below, Social history, Allergies, and medications have been entered into the medical record, reviewed, and no changes needed.   Review of Systems: No fevers, chills, night sweats, weight loss, chest pain, or shortness of breath.   Objective:    General: Well Developed, well nourished, and in no acute distress.  Neuro: Alert and oriented x3, extra-ocular muscles intact, sensation grossly intact.  HEENT: Normocephalic, atraumatic, pupils equal round reactive to light, neck supple, no masses, no lymphadenopathy, thyroid nonpalpable.  Skin: Warm and dry, no rashes. Cardiac: Regular rate and rhythm, no murmurs rubs or gallops, no lower extremity edema.  Respiratory: Clear to auscultation bilaterally. Not using accessory muscles, speaking in full sentences.  Impression and Recommendations:

## 2015-06-19 NOTE — Assessment & Plan Note (Signed)
Anemia is iron deficiency based on iron indices. We will increase ferrous sulfate to twice a day, adding Colace for constipation. Continue omeprazole. Gastroenterology has opted to defer colonoscopy, and do ColoGuard instead.

## 2015-06-19 NOTE — Telephone Encounter (Signed)
received the following information from the OV benefits investigation:  ORTHOVISC is no longer considered preferred products with [employer] as of 01-28-14. To obtain approval for Orthovisc we recommend submitting a pre-cert along with a letter of medical necessity to [facility] by calling [phone] or faxing to [phone]. This is a Medicare Advantage HMO Plus Fully-Funded Plan. The effective date is 01/012017. O-0355 is covered at 80% and 20611 is covered at 80% of the contracted rate when performed in an office setting. Once out of pocket is met coverage will be at 100%. The reference number is [id number].   Contacted Pt and advised of estimated OOP cost. Pt states he cannot afford that. Will route to PCP.

## 2015-06-19 NOTE — Telephone Encounter (Signed)
So whats the preferred agent?

## 2015-06-20 DIAGNOSIS — D649 Anemia, unspecified: Secondary | ICD-10-CM | POA: Diagnosis not present

## 2015-06-20 DIAGNOSIS — I10 Essential (primary) hypertension: Secondary | ICD-10-CM | POA: Diagnosis not present

## 2015-06-20 DIAGNOSIS — I2510 Atherosclerotic heart disease of native coronary artery without angina pectoris: Secondary | ICD-10-CM | POA: Diagnosis not present

## 2015-06-20 DIAGNOSIS — Z951 Presence of aortocoronary bypass graft: Secondary | ICD-10-CM | POA: Diagnosis not present

## 2015-06-20 DIAGNOSIS — R55 Syncope and collapse: Secondary | ICD-10-CM | POA: Diagnosis not present

## 2015-06-20 MED ORDER — SODIUM HYALURONATE (VISCOSUP) 25 MG/2.5ML IX SOSY: PREFILLED_SYRINGE | INTRA_ARTICULAR | Status: DC

## 2015-06-20 NOTE — Telephone Encounter (Signed)
Unsure for [employer]. Can try Supartz or Euflexxa. Would need to be sent to [place].

## 2015-06-20 NOTE — Addendum Note (Signed)
Addended by: Silverio Decamp on: 06/20/2015 11:14 AM   Modules accepted: Orders

## 2015-06-20 NOTE — Telephone Encounter (Signed)
[place] im going to try Supartz.

## 2015-06-22 DIAGNOSIS — R55 Syncope and collapse: Secondary | ICD-10-CM | POA: Diagnosis not present

## 2015-06-22 DIAGNOSIS — Z951 Presence of aortocoronary bypass graft: Secondary | ICD-10-CM | POA: Diagnosis not present

## 2015-06-22 DIAGNOSIS — I2510 Atherosclerotic heart disease of native coronary artery without angina pectoris: Secondary | ICD-10-CM | POA: Diagnosis not present

## 2015-06-22 DIAGNOSIS — I10 Essential (primary) hypertension: Secondary | ICD-10-CM | POA: Diagnosis not present

## 2015-06-22 DIAGNOSIS — D649 Anemia, unspecified: Secondary | ICD-10-CM | POA: Diagnosis not present

## 2015-06-27 DIAGNOSIS — D649 Anemia, unspecified: Secondary | ICD-10-CM | POA: Diagnosis not present

## 2015-06-27 DIAGNOSIS — R55 Syncope and collapse: Secondary | ICD-10-CM | POA: Diagnosis not present

## 2015-06-27 DIAGNOSIS — I10 Essential (primary) hypertension: Secondary | ICD-10-CM | POA: Diagnosis not present

## 2015-06-27 DIAGNOSIS — Z951 Presence of aortocoronary bypass graft: Secondary | ICD-10-CM | POA: Diagnosis not present

## 2015-06-27 DIAGNOSIS — I2510 Atherosclerotic heart disease of native coronary artery without angina pectoris: Secondary | ICD-10-CM | POA: Diagnosis not present

## 2015-06-29 DIAGNOSIS — Z951 Presence of aortocoronary bypass graft: Secondary | ICD-10-CM | POA: Diagnosis not present

## 2015-06-29 DIAGNOSIS — R55 Syncope and collapse: Secondary | ICD-10-CM | POA: Diagnosis not present

## 2015-06-29 DIAGNOSIS — I10 Essential (primary) hypertension: Secondary | ICD-10-CM | POA: Diagnosis not present

## 2015-06-29 DIAGNOSIS — D649 Anemia, unspecified: Secondary | ICD-10-CM | POA: Diagnosis not present

## 2015-06-29 DIAGNOSIS — I2510 Atherosclerotic heart disease of native coronary artery without angina pectoris: Secondary | ICD-10-CM | POA: Diagnosis not present

## 2015-07-04 ENCOUNTER — Encounter: Payer: Self-pay | Admitting: Sports Medicine

## 2015-07-04 ENCOUNTER — Ambulatory Visit (INDEPENDENT_AMBULATORY_CARE_PROVIDER_SITE_OTHER): Payer: Commercial Managed Care - HMO | Admitting: Sports Medicine

## 2015-07-04 VITALS — BP 171/78 | HR 68 | Resp 18 | Wt 144.4 lb

## 2015-07-04 DIAGNOSIS — M25512 Pain in left shoulder: Secondary | ICD-10-CM | POA: Diagnosis not present

## 2015-07-04 DIAGNOSIS — R55 Syncope and collapse: Secondary | ICD-10-CM | POA: Diagnosis not present

## 2015-07-04 DIAGNOSIS — M1711 Unilateral primary osteoarthritis, right knee: Secondary | ICD-10-CM

## 2015-07-04 DIAGNOSIS — D649 Anemia, unspecified: Secondary | ICD-10-CM | POA: Diagnosis not present

## 2015-07-04 DIAGNOSIS — I2510 Atherosclerotic heart disease of native coronary artery without angina pectoris: Secondary | ICD-10-CM | POA: Diagnosis not present

## 2015-07-04 DIAGNOSIS — Z951 Presence of aortocoronary bypass graft: Secondary | ICD-10-CM | POA: Diagnosis not present

## 2015-07-04 DIAGNOSIS — I10 Essential (primary) hypertension: Secondary | ICD-10-CM | POA: Diagnosis not present

## 2015-07-04 NOTE — Progress Notes (Signed)
  Subjective:    CC: Follow-up  HPI: Left shoulder pain: Previous injection was 4 months ago, desires repeat injection today, pain is moderate, persistent, localized over the deltoid and worse with overhead activities.  Right knee osteoarthritis: Here for Supartz injection #1.  Past medical history, Surgical history, Family history not pertinant except as noted below, Social history, Allergies, and medications have been entered into the medical record, reviewed, and no changes needed.   Review of Systems: No fevers, chills, night sweats, weight loss, chest pain, or shortness of breath.   Objective:    General: Well Developed, well nourished, and in no acute distress.  Neuro: Alert and oriented x3, extra-ocular muscles intact, sensation grossly intact.  HEENT: Normocephalic, atraumatic, pupils equal round reactive to light, neck supple, no masses, no lymphadenopathy, thyroid nonpalpable.  Skin: Warm and dry, no rashes. Cardiac: Regular rate and rhythm, no murmurs rubs or gallops, no lower extremity edema.  Respiratory: Clear to auscultation bilaterally. Not using accessory muscles, speaking in full sentences.  Procedure: Real-time Ultrasound Guided Injection of left shoulder Device: GE Logiq E  Verbal informed consent obtained.  Time-out conducted.  Noted no overlying erythema, induration, or other signs of local infection.  Skin prepped in a sterile fashion.  Local anesthesia: Topical Ethyl chloride.  With sterile technique and under real time ultrasound guidance:  Noted deficient rotator cuff, thus this injection was both subacromial and glenohumeral, 1 mL lidocaine, 1 mL Marcaine, 1 mL kenalog 40 injected easily. Completed without difficulty  Pain immediately resolved suggesting accurate placement of the medication.  Advised to call if fevers/chills, erythema, induration, drainage, or persistent bleeding.  Images permanently stored and available for review in the ultrasound unit.    Impression: Technically successful ultrasound guided injection.  Procedure: Real-time Ultrasound Guided Injection of right knee Device: GE Logiq E  Verbal informed consent obtained.  Time-out conducted.  Noted no overlying erythema, induration, or other signs of local infection.  Skin prepped in a sterile fashion.  Local anesthesia: Topical Ethyl chloride.  With sterile technique and under real time ultrasound guidance:   25 mg/2.5 mL of Supartz (sodium hyaluronate) in a prefilled syringe was injected easily into the knee through a 22-gauge needle. Completed without difficulty  Pain immediately resolved suggesting accurate placement of the medication.  Advised to call if fevers/chills, erythema, induration, drainage, or persistent bleeding.  Images permanently stored and available for review in the ultrasound unit.  Impression: Technically successful ultrasound guided injection.  Impression and Recommendations:

## 2015-07-04 NOTE — Assessment & Plan Note (Signed)
Rotator cuff deficient, previous injection was approximately 4 months ago, repeat subacromial/glenohumeral injection. Return as needed.

## 2015-07-04 NOTE — Assessment & Plan Note (Signed)
Supartz injection #1 of 5 today, return in one week for #2 of 5 into the right knee.

## 2015-07-06 DIAGNOSIS — D649 Anemia, unspecified: Secondary | ICD-10-CM | POA: Diagnosis not present

## 2015-07-06 DIAGNOSIS — Z951 Presence of aortocoronary bypass graft: Secondary | ICD-10-CM | POA: Diagnosis not present

## 2015-07-06 DIAGNOSIS — R55 Syncope and collapse: Secondary | ICD-10-CM | POA: Diagnosis not present

## 2015-07-06 DIAGNOSIS — I10 Essential (primary) hypertension: Secondary | ICD-10-CM | POA: Diagnosis not present

## 2015-07-06 DIAGNOSIS — I2510 Atherosclerotic heart disease of native coronary artery without angina pectoris: Secondary | ICD-10-CM | POA: Diagnosis not present

## 2015-07-11 ENCOUNTER — Ambulatory Visit (INDEPENDENT_AMBULATORY_CARE_PROVIDER_SITE_OTHER): Payer: Commercial Managed Care - HMO | Admitting: Sports Medicine

## 2015-07-11 VITALS — BP 145/60 | HR 59 | Resp 16 | Wt 149.2 lb

## 2015-07-11 DIAGNOSIS — H00016 Hordeolum externum left eye, unspecified eyelid: Secondary | ICD-10-CM

## 2015-07-11 DIAGNOSIS — M1711 Unilateral primary osteoarthritis, right knee: Secondary | ICD-10-CM

## 2015-07-11 DIAGNOSIS — H00019 Hordeolum externum unspecified eye, unspecified eyelid: Secondary | ICD-10-CM | POA: Insufficient documentation

## 2015-07-11 MED ORDER — HYDROCODONE-ACETAMINOPHEN 7.5-325 MG PO TABS: 1.0000 | ORAL_TABLET | Freq: Three times a day (TID) | ORAL | Status: DC | PRN

## 2015-07-11 MED ORDER — LINACLOTIDE 145 MCG PO CAPS: 145.0000 ug | ORAL_CAPSULE | Freq: Every day | ORAL | Status: DC

## 2015-07-11 MED ORDER — DOXYCYCLINE HYCLATE 100 MG PO TABS: 100.0000 mg | ORAL_TABLET | Freq: Two times a day (BID) | ORAL | Status: AC

## 2015-07-11 NOTE — Progress Notes (Signed)
  Subjective:    CC: Follow-up  HPI: Right knee osteoarthritis: Here for Supartz injection #2, requesting steroid as well.  Stye: Starting on the left side, lower eyelid.  Past medical history, Surgical history, Family history not pertinant except as noted below, Social history, Allergies, and medications have been entered into the medical record, reviewed, and no changes needed.   Review of Systems: No fevers, chills, night sweats, weight loss, chest pain, or shortness of breath.   Objective:    General: Well Developed, well nourished, and in no acute distress.  Neuro: Alert and oriented x3, extra-ocular muscles intact, sensation grossly intact.  HEENT: Normocephalic, atraumatic, pupils equal round reactive to light, neck supple, no masses, no lymphadenopathy, thyroid nonpalpable.  Skin: Warm and dry, no rashes. Cardiac: Regular rate and rhythm, no murmurs rubs or gallops, no lower extremity edema.  Respiratory: Clear to auscultation bilaterally. Not using accessory muscles, speaking in full sentences.  Procedure: Real-time Ultrasound Guided Injection of right knee Device: GE Logiq E  Verbal informed consent obtained.  Time-out conducted.  Noted no overlying erythema, induration, or other signs of local infection.  Skin prepped in a sterile fashion.  Local anesthesia: Topical Ethyl chloride.  With sterile technique and under real time ultrasound guidance:  Using 18-gauge needle aspirated approximately 40 mL straw-colored fluid, syringe switched 1 mL kenalog 40, 1 mL lidocaine injected, syringe again switched and 25 mg/2.5 mL of Supartz (sodium hyaluronate) in a prefilled syringe was injected easily into the knee through a 22-gauge needle. Completed without difficulty  Pain immediately resolved suggesting accurate placement of the medication.  Advised to call if fevers/chills, erythema, induration, drainage, or persistent bleeding.  Images permanently stored and available for review  in the ultrasound unit.  Impression: Technically successful ultrasound guided injection.  Impression and Recommendations:

## 2015-07-11 NOTE — Assessment & Plan Note (Signed)
Aspiration, steroid, and Supartz injection #2 today, return in one week for #3 of 5.

## 2015-07-11 NOTE — Assessment & Plan Note (Signed)
Left sided, lower eyelid, adding doxycycline for 14 days.

## 2015-07-12 DIAGNOSIS — I10 Essential (primary) hypertension: Secondary | ICD-10-CM | POA: Diagnosis not present

## 2015-07-12 DIAGNOSIS — Z951 Presence of aortocoronary bypass graft: Secondary | ICD-10-CM | POA: Diagnosis not present

## 2015-07-12 DIAGNOSIS — I2510 Atherosclerotic heart disease of native coronary artery without angina pectoris: Secondary | ICD-10-CM | POA: Diagnosis not present

## 2015-07-12 DIAGNOSIS — D649 Anemia, unspecified: Secondary | ICD-10-CM | POA: Diagnosis not present

## 2015-07-12 DIAGNOSIS — R55 Syncope and collapse: Secondary | ICD-10-CM | POA: Diagnosis not present

## 2015-07-13 DIAGNOSIS — D649 Anemia, unspecified: Secondary | ICD-10-CM | POA: Diagnosis not present

## 2015-07-13 DIAGNOSIS — I2510 Atherosclerotic heart disease of native coronary artery without angina pectoris: Secondary | ICD-10-CM | POA: Diagnosis not present

## 2015-07-13 DIAGNOSIS — Z951 Presence of aortocoronary bypass graft: Secondary | ICD-10-CM | POA: Diagnosis not present

## 2015-07-13 DIAGNOSIS — I10 Essential (primary) hypertension: Secondary | ICD-10-CM | POA: Diagnosis not present

## 2015-07-13 DIAGNOSIS — R55 Syncope and collapse: Secondary | ICD-10-CM | POA: Diagnosis not present

## 2015-07-18 ENCOUNTER — Encounter: Payer: Self-pay | Admitting: Sports Medicine

## 2015-07-18 ENCOUNTER — Ambulatory Visit (INDEPENDENT_AMBULATORY_CARE_PROVIDER_SITE_OTHER): Payer: Commercial Managed Care - HMO | Admitting: Sports Medicine

## 2015-07-18 VITALS — BP 172/69 | HR 88 | Wt 137.0 lb

## 2015-07-18 DIAGNOSIS — I2510 Atherosclerotic heart disease of native coronary artery without angina pectoris: Secondary | ICD-10-CM | POA: Diagnosis not present

## 2015-07-18 DIAGNOSIS — D649 Anemia, unspecified: Secondary | ICD-10-CM | POA: Diagnosis not present

## 2015-07-18 DIAGNOSIS — R278 Other lack of coordination: Secondary | ICD-10-CM | POA: Diagnosis not present

## 2015-07-18 DIAGNOSIS — Z951 Presence of aortocoronary bypass graft: Secondary | ICD-10-CM | POA: Diagnosis not present

## 2015-07-18 DIAGNOSIS — I10 Essential (primary) hypertension: Secondary | ICD-10-CM | POA: Diagnosis not present

## 2015-07-18 DIAGNOSIS — M6281 Muscle weakness (generalized): Secondary | ICD-10-CM | POA: Diagnosis not present

## 2015-07-18 DIAGNOSIS — M1711 Unilateral primary osteoarthritis, right knee: Secondary | ICD-10-CM | POA: Diagnosis not present

## 2015-07-18 NOTE — Progress Notes (Signed)
  Procedure: Real-time Ultrasound Guided Injection of right knee Device: GE Logiq E  Verbal informed consent obtained.  Time-out conducted.  Noted no overlying erythema, induration, or other signs of local infection.  Skin prepped in a sterile fashion.  Local anesthesia: Topical Ethyl chloride.  With sterile technique and under real time ultrasound guidance:  25 mg/2.5 mL of Supartz (sodium hyaluronate) in a prefilled syringe was injected easily into the knee through a 22-gauge needle.  Completed without difficulty  Pain immediately resolved suggesting accurate placement of the medication.  Advised to call if fevers/chills, erythema, induration, drainage, or persistent bleeding.  Images permanently stored and available for review in the ultrasound unit.  Impression: Technically successful ultrasound guided injection.

## 2015-07-18 NOTE — Assessment & Plan Note (Signed)
Injection #3 of 5 into the right knee, return in one week for #4, already feeling response.

## 2015-07-20 DIAGNOSIS — I10 Essential (primary) hypertension: Secondary | ICD-10-CM | POA: Diagnosis not present

## 2015-07-20 DIAGNOSIS — M6281 Muscle weakness (generalized): Secondary | ICD-10-CM | POA: Diagnosis not present

## 2015-07-20 DIAGNOSIS — R278 Other lack of coordination: Secondary | ICD-10-CM | POA: Diagnosis not present

## 2015-07-20 DIAGNOSIS — Z951 Presence of aortocoronary bypass graft: Secondary | ICD-10-CM | POA: Diagnosis not present

## 2015-07-20 DIAGNOSIS — D649 Anemia, unspecified: Secondary | ICD-10-CM | POA: Diagnosis not present

## 2015-07-20 DIAGNOSIS — I2510 Atherosclerotic heart disease of native coronary artery without angina pectoris: Secondary | ICD-10-CM | POA: Diagnosis not present

## 2015-07-25 ENCOUNTER — Ambulatory Visit (INDEPENDENT_AMBULATORY_CARE_PROVIDER_SITE_OTHER): Payer: Commercial Managed Care - HMO | Admitting: Sports Medicine

## 2015-07-25 ENCOUNTER — Encounter: Payer: Self-pay | Admitting: Sports Medicine

## 2015-07-25 VITALS — BP 188/74 | HR 59 | Resp 18 | Wt 146.1 lb

## 2015-07-25 DIAGNOSIS — M1711 Unilateral primary osteoarthritis, right knee: Secondary | ICD-10-CM

## 2015-07-25 DIAGNOSIS — I2510 Atherosclerotic heart disease of native coronary artery without angina pectoris: Secondary | ICD-10-CM | POA: Diagnosis not present

## 2015-07-25 DIAGNOSIS — I10 Essential (primary) hypertension: Secondary | ICD-10-CM | POA: Diagnosis not present

## 2015-07-25 DIAGNOSIS — Z951 Presence of aortocoronary bypass graft: Secondary | ICD-10-CM | POA: Diagnosis not present

## 2015-07-25 DIAGNOSIS — D649 Anemia, unspecified: Secondary | ICD-10-CM | POA: Diagnosis not present

## 2015-07-25 DIAGNOSIS — R278 Other lack of coordination: Secondary | ICD-10-CM | POA: Diagnosis not present

## 2015-07-25 DIAGNOSIS — M6281 Muscle weakness (generalized): Secondary | ICD-10-CM | POA: Diagnosis not present

## 2015-07-25 NOTE — Progress Notes (Signed)
  Procedure: Real-time Ultrasound Guided Injection of right knee Device: GE Logiq E  Verbal informed consent obtained.  Time-out conducted.  Noted no overlying erythema, induration, or other signs of local infection.  Skin prepped in a sterile fashion.  Local anesthesia: Topical Ethyl chloride.  With sterile technique and under real time ultrasound guidance:  25 mg/2.5 mL of Supartz (sodium hyaluronate) in a prefilled syringe was injected easily into the knee through a 22-gauge needle.  Completed without difficulty  Pain immediately resolved suggesting accurate placement of the medication.  Advised to call if fevers/chills, erythema, induration, drainage, or persistent bleeding.  Images permanently stored and available for review in the ultrasound unit.  Impression: Technically successful ultrasound guided injection.

## 2015-07-25 NOTE — Assessment & Plan Note (Signed)
Injection #4 into the right knee, return in one week for #5

## 2015-07-27 DIAGNOSIS — I10 Essential (primary) hypertension: Secondary | ICD-10-CM | POA: Diagnosis not present

## 2015-07-27 DIAGNOSIS — Z951 Presence of aortocoronary bypass graft: Secondary | ICD-10-CM | POA: Diagnosis not present

## 2015-07-27 DIAGNOSIS — I2510 Atherosclerotic heart disease of native coronary artery without angina pectoris: Secondary | ICD-10-CM | POA: Diagnosis not present

## 2015-07-27 DIAGNOSIS — D649 Anemia, unspecified: Secondary | ICD-10-CM | POA: Diagnosis not present

## 2015-07-27 DIAGNOSIS — R278 Other lack of coordination: Secondary | ICD-10-CM | POA: Diagnosis not present

## 2015-07-27 DIAGNOSIS — M6281 Muscle weakness (generalized): Secondary | ICD-10-CM | POA: Diagnosis not present

## 2015-07-28 ENCOUNTER — Other Ambulatory Visit: Payer: Self-pay | Admitting: Sports Medicine

## 2015-07-31 ENCOUNTER — Ambulatory Visit (INDEPENDENT_AMBULATORY_CARE_PROVIDER_SITE_OTHER): Payer: Commercial Managed Care - HMO | Admitting: Sports Medicine

## 2015-07-31 ENCOUNTER — Encounter: Payer: Self-pay | Admitting: Sports Medicine

## 2015-07-31 VITALS — BP 173/72 | HR 61 | Resp 16 | Wt 141.4 lb

## 2015-07-31 DIAGNOSIS — M1711 Unilateral primary osteoarthritis, right knee: Secondary | ICD-10-CM | POA: Diagnosis not present

## 2015-07-31 NOTE — Addendum Note (Signed)
Addended by: Elizabeth Sauer on: 07/31/2015 02:57 PM   Modules accepted: Medications

## 2015-07-31 NOTE — Progress Notes (Signed)
  Procedure: Real-time Ultrasound Guided Injection of right knee Device: GE Logiq E  Verbal informed consent obtained.  Time-out conducted.  Noted no overlying erythema, induration, or other signs of local infection.  Skin prepped in a sterile fashion.  Local anesthesia: Topical Ethyl chloride.  With sterile technique and under real time ultrasound guidance:  25 mg/2.5 mL of Supartz (sodium hyaluronate) in a prefilled syringe was injected easily into the knee through a 22-gauge needle.  Completed without difficulty  Pain immediately resolved suggesting accurate placement of the medication.  Advised to call if fevers/chills, erythema, induration, drainage, or persistent bleeding.  Images permanently stored and available for review in the ultrasound unit.  Impression: Technically successful ultrasound guided injection.

## 2015-07-31 NOTE — Assessment & Plan Note (Signed)
Good response, Supartz injection #5 of 5 into the right knee, return as needed.

## 2015-08-01 DIAGNOSIS — I2510 Atherosclerotic heart disease of native coronary artery without angina pectoris: Secondary | ICD-10-CM | POA: Diagnosis not present

## 2015-08-01 DIAGNOSIS — I10 Essential (primary) hypertension: Secondary | ICD-10-CM | POA: Diagnosis not present

## 2015-08-01 DIAGNOSIS — R278 Other lack of coordination: Secondary | ICD-10-CM | POA: Diagnosis not present

## 2015-08-01 DIAGNOSIS — D649 Anemia, unspecified: Secondary | ICD-10-CM | POA: Diagnosis not present

## 2015-08-01 DIAGNOSIS — M6281 Muscle weakness (generalized): Secondary | ICD-10-CM | POA: Diagnosis not present

## 2015-08-01 DIAGNOSIS — Z951 Presence of aortocoronary bypass graft: Secondary | ICD-10-CM | POA: Diagnosis not present

## 2015-08-03 DIAGNOSIS — R278 Other lack of coordination: Secondary | ICD-10-CM | POA: Diagnosis not present

## 2015-08-03 DIAGNOSIS — I2510 Atherosclerotic heart disease of native coronary artery without angina pectoris: Secondary | ICD-10-CM | POA: Diagnosis not present

## 2015-08-03 DIAGNOSIS — I10 Essential (primary) hypertension: Secondary | ICD-10-CM | POA: Diagnosis not present

## 2015-08-03 DIAGNOSIS — D649 Anemia, unspecified: Secondary | ICD-10-CM | POA: Diagnosis not present

## 2015-08-03 DIAGNOSIS — M6281 Muscle weakness (generalized): Secondary | ICD-10-CM | POA: Diagnosis not present

## 2015-08-03 DIAGNOSIS — Z951 Presence of aortocoronary bypass graft: Secondary | ICD-10-CM | POA: Diagnosis not present

## 2015-08-07 ENCOUNTER — Other Ambulatory Visit: Payer: Self-pay | Admitting: Sports Medicine

## 2015-08-08 DIAGNOSIS — Z951 Presence of aortocoronary bypass graft: Secondary | ICD-10-CM | POA: Diagnosis not present

## 2015-08-08 DIAGNOSIS — R278 Other lack of coordination: Secondary | ICD-10-CM | POA: Diagnosis not present

## 2015-08-08 DIAGNOSIS — D649 Anemia, unspecified: Secondary | ICD-10-CM | POA: Diagnosis not present

## 2015-08-08 DIAGNOSIS — I10 Essential (primary) hypertension: Secondary | ICD-10-CM | POA: Diagnosis not present

## 2015-08-08 DIAGNOSIS — I2510 Atherosclerotic heart disease of native coronary artery without angina pectoris: Secondary | ICD-10-CM | POA: Diagnosis not present

## 2015-08-08 DIAGNOSIS — M6281 Muscle weakness (generalized): Secondary | ICD-10-CM | POA: Diagnosis not present

## 2015-08-10 DIAGNOSIS — Z951 Presence of aortocoronary bypass graft: Secondary | ICD-10-CM | POA: Diagnosis not present

## 2015-08-10 DIAGNOSIS — M6281 Muscle weakness (generalized): Secondary | ICD-10-CM | POA: Diagnosis not present

## 2015-08-10 DIAGNOSIS — I10 Essential (primary) hypertension: Secondary | ICD-10-CM | POA: Diagnosis not present

## 2015-08-10 DIAGNOSIS — D649 Anemia, unspecified: Secondary | ICD-10-CM | POA: Diagnosis not present

## 2015-08-10 DIAGNOSIS — R278 Other lack of coordination: Secondary | ICD-10-CM | POA: Diagnosis not present

## 2015-08-10 DIAGNOSIS — I2510 Atherosclerotic heart disease of native coronary artery without angina pectoris: Secondary | ICD-10-CM | POA: Diagnosis not present

## 2015-08-15 DIAGNOSIS — Z951 Presence of aortocoronary bypass graft: Secondary | ICD-10-CM | POA: Diagnosis not present

## 2015-08-15 DIAGNOSIS — I2510 Atherosclerotic heart disease of native coronary artery without angina pectoris: Secondary | ICD-10-CM | POA: Diagnosis not present

## 2015-08-15 DIAGNOSIS — M6281 Muscle weakness (generalized): Secondary | ICD-10-CM | POA: Diagnosis not present

## 2015-08-15 DIAGNOSIS — D649 Anemia, unspecified: Secondary | ICD-10-CM | POA: Diagnosis not present

## 2015-08-15 DIAGNOSIS — I10 Essential (primary) hypertension: Secondary | ICD-10-CM | POA: Diagnosis not present

## 2015-08-15 DIAGNOSIS — R278 Other lack of coordination: Secondary | ICD-10-CM | POA: Diagnosis not present

## 2015-08-17 DIAGNOSIS — I10 Essential (primary) hypertension: Secondary | ICD-10-CM | POA: Diagnosis not present

## 2015-08-17 DIAGNOSIS — I2510 Atherosclerotic heart disease of native coronary artery without angina pectoris: Secondary | ICD-10-CM | POA: Diagnosis not present

## 2015-08-17 DIAGNOSIS — D649 Anemia, unspecified: Secondary | ICD-10-CM | POA: Diagnosis not present

## 2015-08-17 DIAGNOSIS — R278 Other lack of coordination: Secondary | ICD-10-CM | POA: Diagnosis not present

## 2015-08-17 DIAGNOSIS — M6281 Muscle weakness (generalized): Secondary | ICD-10-CM | POA: Diagnosis not present

## 2015-08-17 DIAGNOSIS — Z951 Presence of aortocoronary bypass graft: Secondary | ICD-10-CM | POA: Diagnosis not present

## 2015-08-22 DIAGNOSIS — I2510 Atherosclerotic heart disease of native coronary artery without angina pectoris: Secondary | ICD-10-CM | POA: Diagnosis not present

## 2015-08-22 DIAGNOSIS — M6281 Muscle weakness (generalized): Secondary | ICD-10-CM | POA: Diagnosis not present

## 2015-08-22 DIAGNOSIS — R278 Other lack of coordination: Secondary | ICD-10-CM | POA: Diagnosis not present

## 2015-08-22 DIAGNOSIS — I10 Essential (primary) hypertension: Secondary | ICD-10-CM | POA: Diagnosis not present

## 2015-08-22 DIAGNOSIS — Z951 Presence of aortocoronary bypass graft: Secondary | ICD-10-CM | POA: Diagnosis not present

## 2015-08-22 DIAGNOSIS — D649 Anemia, unspecified: Secondary | ICD-10-CM | POA: Diagnosis not present

## 2015-08-24 DIAGNOSIS — I2510 Atherosclerotic heart disease of native coronary artery without angina pectoris: Secondary | ICD-10-CM | POA: Diagnosis not present

## 2015-08-24 DIAGNOSIS — D649 Anemia, unspecified: Secondary | ICD-10-CM | POA: Diagnosis not present

## 2015-08-24 DIAGNOSIS — Z951 Presence of aortocoronary bypass graft: Secondary | ICD-10-CM | POA: Diagnosis not present

## 2015-08-24 DIAGNOSIS — M6281 Muscle weakness (generalized): Secondary | ICD-10-CM | POA: Diagnosis not present

## 2015-08-24 DIAGNOSIS — R278 Other lack of coordination: Secondary | ICD-10-CM | POA: Diagnosis not present

## 2015-08-24 DIAGNOSIS — I10 Essential (primary) hypertension: Secondary | ICD-10-CM | POA: Diagnosis not present

## 2015-08-26 ENCOUNTER — Other Ambulatory Visit: Payer: Self-pay | Admitting: Sports Medicine

## 2015-08-28 DIAGNOSIS — I10 Essential (primary) hypertension: Secondary | ICD-10-CM | POA: Diagnosis not present

## 2015-08-28 DIAGNOSIS — E222 Syndrome of inappropriate secretion of antidiuretic hormone: Secondary | ICD-10-CM | POA: Diagnosis not present

## 2015-08-28 DIAGNOSIS — R195 Other fecal abnormalities: Secondary | ICD-10-CM | POA: Diagnosis not present

## 2015-08-28 DIAGNOSIS — D500 Iron deficiency anemia secondary to blood loss (chronic): Secondary | ICD-10-CM | POA: Diagnosis not present

## 2015-08-29 DIAGNOSIS — R278 Other lack of coordination: Secondary | ICD-10-CM | POA: Diagnosis not present

## 2015-08-29 DIAGNOSIS — M6281 Muscle weakness (generalized): Secondary | ICD-10-CM | POA: Diagnosis not present

## 2015-08-29 DIAGNOSIS — I2510 Atherosclerotic heart disease of native coronary artery without angina pectoris: Secondary | ICD-10-CM | POA: Diagnosis not present

## 2015-08-29 DIAGNOSIS — I10 Essential (primary) hypertension: Secondary | ICD-10-CM | POA: Diagnosis not present

## 2015-08-29 DIAGNOSIS — Z951 Presence of aortocoronary bypass graft: Secondary | ICD-10-CM | POA: Diagnosis not present

## 2015-08-29 DIAGNOSIS — D649 Anemia, unspecified: Secondary | ICD-10-CM | POA: Diagnosis not present

## 2015-08-30 DIAGNOSIS — Z1211 Encounter for screening for malignant neoplasm of colon: Secondary | ICD-10-CM | POA: Diagnosis not present

## 2015-08-30 DIAGNOSIS — Z1212 Encounter for screening for malignant neoplasm of rectum: Secondary | ICD-10-CM | POA: Diagnosis not present

## 2015-08-31 DIAGNOSIS — I10 Essential (primary) hypertension: Secondary | ICD-10-CM | POA: Diagnosis not present

## 2015-08-31 DIAGNOSIS — R278 Other lack of coordination: Secondary | ICD-10-CM | POA: Diagnosis not present

## 2015-08-31 DIAGNOSIS — I2510 Atherosclerotic heart disease of native coronary artery without angina pectoris: Secondary | ICD-10-CM | POA: Diagnosis not present

## 2015-08-31 DIAGNOSIS — Z951 Presence of aortocoronary bypass graft: Secondary | ICD-10-CM | POA: Diagnosis not present

## 2015-08-31 DIAGNOSIS — D649 Anemia, unspecified: Secondary | ICD-10-CM | POA: Diagnosis not present

## 2015-08-31 DIAGNOSIS — M6281 Muscle weakness (generalized): Secondary | ICD-10-CM | POA: Diagnosis not present

## 2015-09-01 ENCOUNTER — Other Ambulatory Visit: Payer: Self-pay | Admitting: Sports Medicine

## 2015-09-03 ENCOUNTER — Other Ambulatory Visit: Payer: Self-pay | Admitting: Sports Medicine

## 2015-09-04 DIAGNOSIS — E222 Syndrome of inappropriate secretion of antidiuretic hormone: Secondary | ICD-10-CM | POA: Diagnosis not present

## 2015-09-05 DIAGNOSIS — M6281 Muscle weakness (generalized): Secondary | ICD-10-CM | POA: Diagnosis not present

## 2015-09-05 DIAGNOSIS — Z951 Presence of aortocoronary bypass graft: Secondary | ICD-10-CM | POA: Diagnosis not present

## 2015-09-05 DIAGNOSIS — I10 Essential (primary) hypertension: Secondary | ICD-10-CM | POA: Diagnosis not present

## 2015-09-05 DIAGNOSIS — R278 Other lack of coordination: Secondary | ICD-10-CM | POA: Diagnosis not present

## 2015-09-05 DIAGNOSIS — D649 Anemia, unspecified: Secondary | ICD-10-CM | POA: Diagnosis not present

## 2015-09-05 DIAGNOSIS — I2510 Atherosclerotic heart disease of native coronary artery without angina pectoris: Secondary | ICD-10-CM | POA: Diagnosis not present

## 2015-09-07 DIAGNOSIS — I10 Essential (primary) hypertension: Secondary | ICD-10-CM | POA: Diagnosis not present

## 2015-09-07 DIAGNOSIS — I2510 Atherosclerotic heart disease of native coronary artery without angina pectoris: Secondary | ICD-10-CM | POA: Diagnosis not present

## 2015-09-07 DIAGNOSIS — M6281 Muscle weakness (generalized): Secondary | ICD-10-CM | POA: Diagnosis not present

## 2015-09-07 DIAGNOSIS — Z951 Presence of aortocoronary bypass graft: Secondary | ICD-10-CM | POA: Diagnosis not present

## 2015-09-07 DIAGNOSIS — R278 Other lack of coordination: Secondary | ICD-10-CM | POA: Diagnosis not present

## 2015-09-07 DIAGNOSIS — D649 Anemia, unspecified: Secondary | ICD-10-CM | POA: Diagnosis not present

## 2015-09-11 DIAGNOSIS — E871 Hypo-osmolality and hyponatremia: Secondary | ICD-10-CM | POA: Diagnosis not present

## 2015-09-11 DIAGNOSIS — E222 Syndrome of inappropriate secretion of antidiuretic hormone: Secondary | ICD-10-CM | POA: Diagnosis not present

## 2015-09-12 ENCOUNTER — Other Ambulatory Visit: Payer: Self-pay

## 2015-09-12 DIAGNOSIS — R278 Other lack of coordination: Secondary | ICD-10-CM | POA: Diagnosis not present

## 2015-09-12 DIAGNOSIS — I2510 Atherosclerotic heart disease of native coronary artery without angina pectoris: Secondary | ICD-10-CM | POA: Diagnosis not present

## 2015-09-12 DIAGNOSIS — D649 Anemia, unspecified: Secondary | ICD-10-CM | POA: Diagnosis not present

## 2015-09-12 DIAGNOSIS — I10 Essential (primary) hypertension: Secondary | ICD-10-CM | POA: Diagnosis not present

## 2015-09-12 DIAGNOSIS — Z951 Presence of aortocoronary bypass graft: Secondary | ICD-10-CM | POA: Diagnosis not present

## 2015-09-12 DIAGNOSIS — M6281 Muscle weakness (generalized): Secondary | ICD-10-CM | POA: Diagnosis not present

## 2015-09-12 MED ORDER — LISINOPRIL 40 MG PO TABS: ORAL_TABLET | ORAL | 1 refills | Status: DC

## 2015-09-12 MED ORDER — DOXEPIN HCL 25 MG PO CAPS: ORAL_CAPSULE | ORAL | 1 refills | Status: DC

## 2015-09-14 DIAGNOSIS — M6281 Muscle weakness (generalized): Secondary | ICD-10-CM | POA: Diagnosis not present

## 2015-09-14 DIAGNOSIS — I10 Essential (primary) hypertension: Secondary | ICD-10-CM | POA: Diagnosis not present

## 2015-09-14 DIAGNOSIS — R278 Other lack of coordination: Secondary | ICD-10-CM | POA: Diagnosis not present

## 2015-09-14 DIAGNOSIS — D649 Anemia, unspecified: Secondary | ICD-10-CM | POA: Diagnosis not present

## 2015-09-14 DIAGNOSIS — I2510 Atherosclerotic heart disease of native coronary artery without angina pectoris: Secondary | ICD-10-CM | POA: Diagnosis not present

## 2015-09-14 DIAGNOSIS — Z951 Presence of aortocoronary bypass graft: Secondary | ICD-10-CM | POA: Diagnosis not present

## 2015-09-17 DIAGNOSIS — R935 Abnormal findings on diagnostic imaging of other abdominal regions, including retroperitoneum: Secondary | ICD-10-CM | POA: Diagnosis not present

## 2015-09-17 DIAGNOSIS — N329 Bladder disorder, unspecified: Secondary | ICD-10-CM | POA: Diagnosis not present

## 2015-09-17 DIAGNOSIS — N1339 Other hydronephrosis: Secondary | ICD-10-CM | POA: Diagnosis not present

## 2015-09-17 DIAGNOSIS — N134 Hydroureter: Secondary | ICD-10-CM | POA: Diagnosis not present

## 2015-09-17 DIAGNOSIS — K8020 Calculus of gallbladder without cholecystitis without obstruction: Secondary | ICD-10-CM | POA: Diagnosis not present

## 2015-09-17 DIAGNOSIS — R319 Hematuria, unspecified: Secondary | ICD-10-CM | POA: Diagnosis not present

## 2015-09-17 DIAGNOSIS — K5730 Diverticulosis of large intestine without perforation or abscess without bleeding: Secondary | ICD-10-CM | POA: Diagnosis not present

## 2015-09-17 DIAGNOSIS — N1330 Unspecified hydronephrosis: Secondary | ICD-10-CM | POA: Diagnosis not present

## 2015-09-17 DIAGNOSIS — I2510 Atherosclerotic heart disease of native coronary artery without angina pectoris: Secondary | ICD-10-CM | POA: Diagnosis not present

## 2015-09-17 DIAGNOSIS — I252 Old myocardial infarction: Secondary | ICD-10-CM | POA: Diagnosis not present

## 2015-09-17 DIAGNOSIS — Z8546 Personal history of malignant neoplasm of prostate: Secondary | ICD-10-CM | POA: Diagnosis not present

## 2015-09-17 DIAGNOSIS — I10 Essential (primary) hypertension: Secondary | ICD-10-CM | POA: Diagnosis not present

## 2015-09-18 DIAGNOSIS — I10 Essential (primary) hypertension: Secondary | ICD-10-CM | POA: Diagnosis not present

## 2015-09-18 DIAGNOSIS — E222 Syndrome of inappropriate secretion of antidiuretic hormone: Secondary | ICD-10-CM | POA: Diagnosis not present

## 2015-09-18 DIAGNOSIS — E871 Hypo-osmolality and hyponatremia: Secondary | ICD-10-CM | POA: Diagnosis not present

## 2015-09-18 DIAGNOSIS — R195 Other fecal abnormalities: Secondary | ICD-10-CM | POA: Diagnosis not present

## 2015-09-19 ENCOUNTER — Ambulatory Visit: Payer: Commercial Managed Care - HMO | Admitting: Sports Medicine

## 2015-09-19 ENCOUNTER — Other Ambulatory Visit: Payer: Self-pay | Admitting: Sports Medicine

## 2015-09-19 DIAGNOSIS — M1711 Unilateral primary osteoarthritis, right knee: Secondary | ICD-10-CM

## 2015-09-19 DIAGNOSIS — N3289 Other specified disorders of bladder: Secondary | ICD-10-CM | POA: Diagnosis not present

## 2015-09-19 MED ORDER — HYDROCODONE-ACETAMINOPHEN 7.5-325 MG PO TABS: 1.0000 | ORAL_TABLET | Freq: Three times a day (TID) | ORAL | 0 refills | Status: DC | PRN

## 2015-09-21 ENCOUNTER — Telehealth: Payer: Self-pay

## 2015-09-21 MED ORDER — GABAPENTIN 300 MG PO CAPS: ORAL_CAPSULE | ORAL | 3 refills | Status: DC

## 2015-09-21 NOTE — Telephone Encounter (Signed)
It certainly should not have been, I'm going to send it in now.

## 2015-09-21 NOTE — Telephone Encounter (Signed)
Patient advised of prescription.

## 2015-09-21 NOTE — Telephone Encounter (Signed)
Pt left VM asking why his gabapentin refill was denied. Please advise

## 2015-09-28 DIAGNOSIS — Z9989 Dependence on other enabling machines and devices: Secondary | ICD-10-CM | POA: Diagnosis not present

## 2015-09-28 DIAGNOSIS — Z87891 Personal history of nicotine dependence: Secondary | ICD-10-CM | POA: Diagnosis not present

## 2015-09-28 DIAGNOSIS — I252 Old myocardial infarction: Secondary | ICD-10-CM | POA: Diagnosis not present

## 2015-09-28 DIAGNOSIS — C678 Malignant neoplasm of overlapping sites of bladder: Secondary | ICD-10-CM | POA: Diagnosis not present

## 2015-09-28 DIAGNOSIS — N131 Hydronephrosis with ureteral stricture, not elsewhere classified: Secondary | ICD-10-CM | POA: Diagnosis not present

## 2015-09-28 DIAGNOSIS — G4730 Sleep apnea, unspecified: Secondary | ICD-10-CM | POA: Diagnosis not present

## 2015-09-28 DIAGNOSIS — C679 Malignant neoplasm of bladder, unspecified: Secondary | ICD-10-CM | POA: Diagnosis not present

## 2015-09-28 DIAGNOSIS — Z951 Presence of aortocoronary bypass graft: Secondary | ICD-10-CM | POA: Diagnosis not present

## 2015-09-28 DIAGNOSIS — D494 Neoplasm of unspecified behavior of bladder: Secondary | ICD-10-CM | POA: Diagnosis not present

## 2015-09-28 DIAGNOSIS — I10 Essential (primary) hypertension: Secondary | ICD-10-CM | POA: Diagnosis not present

## 2015-09-28 DIAGNOSIS — I2510 Atherosclerotic heart disease of native coronary artery without angina pectoris: Secondary | ICD-10-CM | POA: Diagnosis not present

## 2015-09-28 DIAGNOSIS — N1330 Unspecified hydronephrosis: Secondary | ICD-10-CM | POA: Diagnosis not present

## 2015-09-28 DIAGNOSIS — C670 Malignant neoplasm of trigone of bladder: Secondary | ICD-10-CM | POA: Diagnosis not present

## 2015-09-29 DIAGNOSIS — G4730 Sleep apnea, unspecified: Secondary | ICD-10-CM | POA: Diagnosis not present

## 2015-09-29 DIAGNOSIS — I2510 Atherosclerotic heart disease of native coronary artery without angina pectoris: Secondary | ICD-10-CM | POA: Diagnosis not present

## 2015-09-29 DIAGNOSIS — D494 Neoplasm of unspecified behavior of bladder: Secondary | ICD-10-CM | POA: Diagnosis not present

## 2015-09-29 DIAGNOSIS — N131 Hydronephrosis with ureteral stricture, not elsewhere classified: Secondary | ICD-10-CM | POA: Diagnosis not present

## 2015-09-29 DIAGNOSIS — Z87891 Personal history of nicotine dependence: Secondary | ICD-10-CM | POA: Diagnosis not present

## 2015-09-29 DIAGNOSIS — I10 Essential (primary) hypertension: Secondary | ICD-10-CM | POA: Diagnosis not present

## 2015-09-29 DIAGNOSIS — C670 Malignant neoplasm of trigone of bladder: Secondary | ICD-10-CM | POA: Diagnosis not present

## 2015-09-29 DIAGNOSIS — I252 Old myocardial infarction: Secondary | ICD-10-CM | POA: Diagnosis not present

## 2015-09-29 DIAGNOSIS — Z9989 Dependence on other enabling machines and devices: Secondary | ICD-10-CM | POA: Diagnosis not present

## 2015-09-29 DIAGNOSIS — Z951 Presence of aortocoronary bypass graft: Secondary | ICD-10-CM | POA: Diagnosis not present

## 2015-10-04 DIAGNOSIS — I2510 Atherosclerotic heart disease of native coronary artery without angina pectoris: Secondary | ICD-10-CM | POA: Diagnosis not present

## 2015-10-04 DIAGNOSIS — C678 Malignant neoplasm of overlapping sites of bladder: Secondary | ICD-10-CM | POA: Diagnosis not present

## 2015-10-04 DIAGNOSIS — I2583 Coronary atherosclerosis due to lipid rich plaque: Secondary | ICD-10-CM | POA: Diagnosis not present

## 2015-10-05 DIAGNOSIS — K921 Melena: Secondary | ICD-10-CM | POA: Diagnosis not present

## 2015-10-11 DIAGNOSIS — I2510 Atherosclerotic heart disease of native coronary artery without angina pectoris: Secondary | ICD-10-CM | POA: Diagnosis not present

## 2015-10-11 DIAGNOSIS — C679 Malignant neoplasm of bladder, unspecified: Secondary | ICD-10-CM | POA: Diagnosis not present

## 2015-10-11 DIAGNOSIS — I2583 Coronary atherosclerosis due to lipid rich plaque: Secondary | ICD-10-CM | POA: Diagnosis not present

## 2015-10-12 DIAGNOSIS — R339 Retention of urine, unspecified: Secondary | ICD-10-CM | POA: Diagnosis not present

## 2015-10-17 DIAGNOSIS — I890 Lymphedema, not elsewhere classified: Secondary | ICD-10-CM | POA: Diagnosis not present

## 2015-10-17 DIAGNOSIS — Z08 Encounter for follow-up examination after completed treatment for malignant neoplasm: Secondary | ICD-10-CM | POA: Diagnosis not present

## 2015-10-17 DIAGNOSIS — N135 Crossing vessel and stricture of ureter without hydronephrosis: Secondary | ICD-10-CM | POA: Diagnosis not present

## 2015-10-17 DIAGNOSIS — F17210 Nicotine dependence, cigarettes, uncomplicated: Secondary | ICD-10-CM | POA: Diagnosis not present

## 2015-10-17 DIAGNOSIS — E785 Hyperlipidemia, unspecified: Secondary | ICD-10-CM | POA: Diagnosis not present

## 2015-10-17 DIAGNOSIS — C679 Malignant neoplasm of bladder, unspecified: Secondary | ICD-10-CM | POA: Diagnosis not present

## 2015-10-17 DIAGNOSIS — R7989 Other specified abnormal findings of blood chemistry: Secondary | ICD-10-CM | POA: Diagnosis not present

## 2015-10-17 DIAGNOSIS — Z8546 Personal history of malignant neoplasm of prostate: Secondary | ICD-10-CM | POA: Diagnosis not present

## 2015-10-17 DIAGNOSIS — I2510 Atherosclerotic heart disease of native coronary artery without angina pectoris: Secondary | ICD-10-CM | POA: Diagnosis not present

## 2015-10-17 DIAGNOSIS — Z9079 Acquired absence of other genital organ(s): Secondary | ICD-10-CM | POA: Diagnosis not present

## 2015-10-17 DIAGNOSIS — I2583 Coronary atherosclerosis due to lipid rich plaque: Secondary | ICD-10-CM | POA: Diagnosis not present

## 2015-10-17 DIAGNOSIS — D649 Anemia, unspecified: Secondary | ICD-10-CM | POA: Diagnosis not present

## 2015-10-18 ENCOUNTER — Telehealth: Payer: Self-pay

## 2015-10-18 DIAGNOSIS — C61 Malignant neoplasm of prostate: Secondary | ICD-10-CM

## 2015-10-18 NOTE — Telephone Encounter (Signed)
Pt. [facility] Oncologist, Dr. Theda Sers, would like for you to refer patient to Dr. Elyse Hsu. Due to patients insurance states that pt needs to be referred by his PCP. Please assist.

## 2015-10-18 NOTE — Telephone Encounter (Signed)
Done

## 2015-10-20 ENCOUNTER — Other Ambulatory Visit: Payer: Self-pay

## 2015-10-20 DIAGNOSIS — C4490 Unspecified malignant neoplasm of skin, unspecified: Secondary | ICD-10-CM

## 2015-10-20 NOTE — Progress Notes (Signed)
Sent referral for follow up appointment for skin cancer.

## 2015-10-23 ENCOUNTER — Ambulatory Visit (INDEPENDENT_AMBULATORY_CARE_PROVIDER_SITE_OTHER): Payer: Commercial Managed Care - HMO | Admitting: Sports Medicine

## 2015-10-23 VITALS — BP 145/69 | HR 89 | Resp 18 | Wt 138.9 lb

## 2015-10-23 DIAGNOSIS — Z1321 Encounter for screening for nutritional disorder: Secondary | ICD-10-CM | POA: Diagnosis not present

## 2015-10-23 DIAGNOSIS — Z23 Encounter for immunization: Secondary | ICD-10-CM | POA: Diagnosis not present

## 2015-10-23 DIAGNOSIS — C679 Malignant neoplasm of bladder, unspecified: Secondary | ICD-10-CM | POA: Diagnosis not present

## 2015-10-23 DIAGNOSIS — D649 Anemia, unspecified: Secondary | ICD-10-CM | POA: Diagnosis not present

## 2015-10-23 DIAGNOSIS — Z0000 Encounter for general adult medical examination without abnormal findings: Secondary | ICD-10-CM

## 2015-10-23 HISTORY — DX: Malignant neoplasm of bladder, unspecified: C67.9

## 2015-10-23 MED ORDER — FERROUS SULFATE 325 (65 FE) MG PO TBEC: 325.0000 mg | DELAYED_RELEASE_TABLET | Freq: Three times a day (TID) | ORAL | 3 refills | Status: DC

## 2015-10-23 NOTE — Progress Notes (Signed)
  Subjective:    CC: fatigue    HPI: 82 yo with history of anemia and bladder cancer presenting for follow-up of fatigue.  He is in the process of getting treated for bladder cancer - he had a transurethral resection of bladder earlier this month via cystoscopy, but cancer remained within his bladder wall.  He is meeting with a surgical oncologist tomorrow to discuss chemo vs. Cystectomy.  He says he has had urinary incontinence since his TURB.  He also says he has been fatigued over the past two weeks.  Most recent CBC 2 weeks ago was 9.3.  He is currently taking iron twice per day.  He is due for a colonoscopy but is having difficulty getting it covered by his insurance carrier.    Past medical history:  Negative.  See flowsheet/record as well for more information.  Surgical history: Negative.  See flowsheet/record as well for more information.  Family history: Negative.  See flowsheet/record as well for more information.  Social history: Negative.  See flowsheet/record as well for more information.  Allergies, and medications have been entered into the medical record, reviewed, and no changes needed.   Review of Systems: No fevers, chills, night sweats, weight loss, chest pain, or shortness of breath.   Objective:    General: Well Developed, well nourished, and in no acute distress.  Neuro: Alert and oriented x3, extra-ocular muscles intact, sensation grossly intact.  HEENT: Normocephalic, atraumatic, pupils equal round reactive to light, neck supple, no masses, no lymphadenopathy, thyroid nonpalpable.  Skin: Warm and dry, no rashes. Cardiac: Regular rate and rhythm, no murmurs rubs or gallops, no lower extremity edema.  Respiratory: Clear to auscultation bilaterally. Not using accessory muscles, speaking in full sentences.   Impression and Recommendations:    1. Anemia: concern for lower GI bleed given chronic anemia in patient with no recent colonoscopy.  Most recent CBC two weeks ago  with Hb of 9.3. -Increase iron pills to three times per day -Check CBC, iron studies today  -Colonoscopy as soon as possible  -Return in 1 month to re-check anemia   2. Bladder cancer -Follow-up with oncology team, it sounds like chemo vs. Cystectomy  -Return in 1 month to update [place] on treatment plan    3. Health maintenance: received shingles vaccine in past 10 years -flu shot today -second PNA shot today

## 2015-10-23 NOTE — Assessment & Plan Note (Signed)
Up-to-date on shingles vaccine, influence and pneumococcal 13 given today.

## 2015-10-23 NOTE — Assessment & Plan Note (Signed)
This is likely also contributing into his blood loss. He does have follow-ups with oncology and urology.

## 2015-10-23 NOTE — Assessment & Plan Note (Signed)
Rechecking iron indices. He has not yet had a colonoscopy or upper endoscopy but this is planned. Increasing iron to 3 times per day.

## 2015-10-24 DIAGNOSIS — I2510 Atherosclerotic heart disease of native coronary artery without angina pectoris: Secondary | ICD-10-CM | POA: Diagnosis not present

## 2015-10-24 DIAGNOSIS — I252 Old myocardial infarction: Secondary | ICD-10-CM | POA: Diagnosis not present

## 2015-10-24 DIAGNOSIS — I5030 Unspecified diastolic (congestive) heart failure: Secondary | ICD-10-CM | POA: Diagnosis not present

## 2015-10-24 DIAGNOSIS — I110 Hypertensive heart disease with heart failure: Secondary | ICD-10-CM | POA: Diagnosis not present

## 2015-10-24 DIAGNOSIS — D500 Iron deficiency anemia secondary to blood loss (chronic): Secondary | ICD-10-CM | POA: Diagnosis not present

## 2015-10-24 DIAGNOSIS — Z87891 Personal history of nicotine dependence: Secondary | ICD-10-CM | POA: Diagnosis not present

## 2015-10-24 DIAGNOSIS — E785 Hyperlipidemia, unspecified: Secondary | ICD-10-CM | POA: Diagnosis not present

## 2015-10-24 DIAGNOSIS — C61 Malignant neoplasm of prostate: Secondary | ICD-10-CM | POA: Diagnosis not present

## 2015-10-24 DIAGNOSIS — C679 Malignant neoplasm of bladder, unspecified: Secondary | ICD-10-CM | POA: Diagnosis not present

## 2015-10-24 LAB — CBC
HCT: 29.9 % — ABNORMAL LOW (ref 38.5–50.0)
Hemoglobin: 9.8 g/dL — ABNORMAL LOW (ref 13.2–17.1)
MCH: 29 pg (ref 27.0–33.0)
MCHC: 32.8 g/dL (ref 32.0–36.0)
MCV: 88.5 fL (ref 80.0–100.0)
MPV: 10.1 fL (ref 7.5–12.5)
Platelets: 225 K/uL (ref 140–400)
RBC: 3.38 MIL/uL — ABNORMAL LOW (ref 4.20–5.80)
RDW: 14.6 % (ref 11.0–15.0)
WBC: 6.4 K/uL (ref 3.8–10.8)

## 2015-10-24 LAB — IRON AND TIBC
%SAT: 18 % (ref 15–60)
Iron: 43 ug/dL — ABNORMAL LOW (ref 50–180)
TIBC: 244 ug/dL — ABNORMAL LOW (ref 250–425)
UIBC: 201 ug/dL (ref 125–400)

## 2015-10-24 LAB — RETICULOCYTES
ABS Retic: 20280 {cells}/uL — ABNORMAL LOW (ref 25000–90000)
RBC.: 3.38 MIL/uL — ABNORMAL LOW (ref 4.20–5.80)
Retic Ct Pct: 0.6 %

## 2015-10-24 LAB — VITAMIN B12: Vitamin B-12: 808 pg/mL (ref 200–1100)

## 2015-10-24 LAB — FERRITIN: Ferritin: 515 ng/mL — ABNORMAL HIGH (ref 20–380)

## 2015-10-24 LAB — FOLATE: Folate: 21.2 ng/mL (ref >5.4)

## 2015-10-31 DIAGNOSIS — K5730 Diverticulosis of large intestine without perforation or abscess without bleeding: Secondary | ICD-10-CM | POA: Diagnosis not present

## 2015-10-31 DIAGNOSIS — N1330 Unspecified hydronephrosis: Secondary | ICD-10-CM | POA: Diagnosis not present

## 2015-10-31 DIAGNOSIS — N134 Hydroureter: Secondary | ICD-10-CM | POA: Diagnosis not present

## 2015-10-31 DIAGNOSIS — Z960 Presence of urogenital implants: Secondary | ICD-10-CM | POA: Diagnosis not present

## 2015-10-31 DIAGNOSIS — C679 Malignant neoplasm of bladder, unspecified: Secondary | ICD-10-CM | POA: Diagnosis not present

## 2015-10-31 DIAGNOSIS — K8020 Calculus of gallbladder without cholecystitis without obstruction: Secondary | ICD-10-CM | POA: Diagnosis not present

## 2015-11-06 DIAGNOSIS — C679 Malignant neoplasm of bladder, unspecified: Secondary | ICD-10-CM | POA: Diagnosis not present

## 2015-11-06 DIAGNOSIS — Z08 Encounter for follow-up examination after completed treatment for malignant neoplasm: Secondary | ICD-10-CM | POA: Diagnosis not present

## 2015-11-06 DIAGNOSIS — Z9079 Acquired absence of other genital organ(s): Secondary | ICD-10-CM | POA: Diagnosis not present

## 2015-11-06 DIAGNOSIS — D649 Anemia, unspecified: Secondary | ICD-10-CM | POA: Diagnosis not present

## 2015-11-06 DIAGNOSIS — I110 Hypertensive heart disease with heart failure: Secondary | ICD-10-CM | POA: Diagnosis not present

## 2015-11-06 DIAGNOSIS — R7989 Other specified abnormal findings of blood chemistry: Secondary | ICD-10-CM | POA: Diagnosis not present

## 2015-11-06 DIAGNOSIS — N1330 Unspecified hydronephrosis: Secondary | ICD-10-CM | POA: Diagnosis not present

## 2015-11-06 DIAGNOSIS — Z8546 Personal history of malignant neoplasm of prostate: Secondary | ICD-10-CM | POA: Diagnosis not present

## 2015-11-06 DIAGNOSIS — I2510 Atherosclerotic heart disease of native coronary artery without angina pectoris: Secondary | ICD-10-CM | POA: Diagnosis not present

## 2015-11-06 DIAGNOSIS — G893 Neoplasm related pain (acute) (chronic): Secondary | ICD-10-CM | POA: Diagnosis not present

## 2015-11-06 DIAGNOSIS — I5030 Unspecified diastolic (congestive) heart failure: Secondary | ICD-10-CM | POA: Diagnosis not present

## 2015-11-07 DIAGNOSIS — R630 Anorexia: Secondary | ICD-10-CM | POA: Diagnosis not present

## 2015-11-07 DIAGNOSIS — D509 Iron deficiency anemia, unspecified: Secondary | ICD-10-CM | POA: Diagnosis not present

## 2015-11-07 DIAGNOSIS — R195 Other fecal abnormalities: Secondary | ICD-10-CM | POA: Diagnosis not present

## 2015-11-20 ENCOUNTER — Ambulatory Visit: Payer: Commercial Managed Care - HMO | Admitting: Sports Medicine

## 2015-11-20 DIAGNOSIS — K5790 Diverticulosis of intestine, part unspecified, without perforation or abscess without bleeding: Secondary | ICD-10-CM | POA: Diagnosis not present

## 2015-11-20 DIAGNOSIS — R195 Other fecal abnormalities: Secondary | ICD-10-CM | POA: Diagnosis not present

## 2015-11-20 DIAGNOSIS — D123 Benign neoplasm of transverse colon: Secondary | ICD-10-CM | POA: Diagnosis not present

## 2015-11-20 DIAGNOSIS — G4730 Sleep apnea, unspecified: Secondary | ICD-10-CM | POA: Diagnosis not present

## 2015-11-20 DIAGNOSIS — I10 Essential (primary) hypertension: Secondary | ICD-10-CM | POA: Diagnosis not present

## 2015-11-20 DIAGNOSIS — Z79899 Other long term (current) drug therapy: Secondary | ICD-10-CM | POA: Diagnosis not present

## 2015-11-20 DIAGNOSIS — Z87891 Personal history of nicotine dependence: Secondary | ICD-10-CM | POA: Diagnosis not present

## 2015-11-20 DIAGNOSIS — D128 Benign neoplasm of rectum: Secondary | ICD-10-CM | POA: Diagnosis not present

## 2015-11-20 DIAGNOSIS — D509 Iron deficiency anemia, unspecified: Secondary | ICD-10-CM | POA: Diagnosis not present

## 2015-11-20 DIAGNOSIS — I252 Old myocardial infarction: Secondary | ICD-10-CM | POA: Diagnosis not present

## 2015-11-20 DIAGNOSIS — E7800 Pure hypercholesterolemia, unspecified: Secondary | ICD-10-CM | POA: Diagnosis not present

## 2015-11-20 DIAGNOSIS — Z889 Allergy status to unspecified drugs, medicaments and biological substances status: Secondary | ICD-10-CM | POA: Diagnosis not present

## 2015-11-22 DIAGNOSIS — I2583 Coronary atherosclerosis due to lipid rich plaque: Secondary | ICD-10-CM | POA: Diagnosis not present

## 2015-11-22 DIAGNOSIS — I2510 Atherosclerotic heart disease of native coronary artery without angina pectoris: Secondary | ICD-10-CM | POA: Diagnosis not present

## 2015-11-22 DIAGNOSIS — C678 Malignant neoplasm of overlapping sites of bladder: Secondary | ICD-10-CM | POA: Diagnosis not present

## 2015-11-23 ENCOUNTER — Ambulatory Visit (INDEPENDENT_AMBULATORY_CARE_PROVIDER_SITE_OTHER): Payer: Commercial Managed Care - HMO | Admitting: Sports Medicine

## 2015-11-23 ENCOUNTER — Encounter: Payer: Self-pay | Admitting: Sports Medicine

## 2015-11-23 DIAGNOSIS — D649 Anemia, unspecified: Secondary | ICD-10-CM | POA: Diagnosis not present

## 2015-11-23 DIAGNOSIS — C679 Malignant neoplasm of bladder, unspecified: Secondary | ICD-10-CM

## 2015-11-23 LAB — POCT HEMOGLOBIN: Hemoglobin: 8.4 g/dL — AB (ref 14.1–18.1)

## 2015-11-23 NOTE — Assessment & Plan Note (Addendum)
Iron deficiency anemia, now is post colonoscopy and upper endoscopy with treatment of a bleeding polyp. Rechecking hemoglobin. Overall has been able to tolerate iron 3 times per day as long as he takes his Linzess.  Hemoglobin has dropped to 8.4, I would like him to be set up for iron infusions, we will use his hematologist/oncologist with [facility]

## 2015-11-23 NOTE — Progress Notes (Signed)
  Subjective:    CC: Follow-up  HPI: Anemia: Persistent. He is taking iron 3 times per day, constipation is mitigated by Linzess. He did have an upper and lower endoscopy, the only thing noted was a friable polyp. This was removed.  Bladder cancer: Follow-up is ongoing with urology, it sounds as though cystectomy is planned.  Past medical history:  Negative.  See flowsheet/record as well for more information.  Surgical history: Negative.  See flowsheet/record as well for more information.  Family history: Negative.  See flowsheet/record as well for more information.  Social history: Negative.  See flowsheet/record as well for more information.  Allergies, and medications have been entered into the medical record, reviewed, and no changes needed.   Review of Systems: No fevers, chills, night sweats, weight loss, chest pain, or shortness of breath.   Objective:    General: Well Developed, well nourished, and in no acute distress.  Neuro: Alert and oriented x3, extra-ocular muscles intact, sensation grossly intact.  HEENT: Normocephalic, atraumatic, pupils equal round reactive to light, neck supple, no masses, no lymphadenopathy, thyroid nonpalpable.  Skin: Warm and dry, no rashes. Cardiac: Regular rate and rhythm, no murmurs rubs or gallops, no lower extremity edema.  Respiratory: Clear to auscultation bilaterally. Not using accessory muscles, speaking in full sentences.  Impression and Recommendations:    Normocytic anemia Iron deficiency anemia, now is post colonoscopy and upper endoscopy with treatment of a bleeding polyp. Rechecking hemoglobin. Overall has been able to tolerate iron 3 times per day as long as he takes his Linzess.  Hemoglobin has dropped to 8.4, I would like him to be set up for iron infusions, we will use his hematologist/oncologist with [facility]  Bladder cancer [facility]) Keep close follow-up with oncology and urology  I spent 25 minutes with this patient, greater  than 50% was face-to-face time counseling regarding the above diagnoses

## 2015-11-23 NOTE — Assessment & Plan Note (Signed)
Keep close follow-up with oncology and urology

## 2015-11-24 DIAGNOSIS — G4733 Obstructive sleep apnea (adult) (pediatric): Secondary | ICD-10-CM | POA: Diagnosis not present

## 2015-11-27 ENCOUNTER — Other Ambulatory Visit: Payer: Self-pay | Admitting: Sports Medicine

## 2015-11-29 ENCOUNTER — Other Ambulatory Visit: Payer: Self-pay | Admitting: Sports Medicine

## 2015-11-29 DIAGNOSIS — N135 Crossing vessel and stricture of ureter without hydronephrosis: Secondary | ICD-10-CM | POA: Diagnosis not present

## 2015-11-29 DIAGNOSIS — Z951 Presence of aortocoronary bypass graft: Secondary | ICD-10-CM

## 2015-11-29 DIAGNOSIS — C679 Malignant neoplasm of bladder, unspecified: Secondary | ICD-10-CM | POA: Diagnosis not present

## 2015-11-29 DIAGNOSIS — Z8546 Personal history of malignant neoplasm of prostate: Secondary | ICD-10-CM | POA: Diagnosis not present

## 2015-11-30 DIAGNOSIS — Z951 Presence of aortocoronary bypass graft: Secondary | ICD-10-CM | POA: Diagnosis not present

## 2015-11-30 DIAGNOSIS — I2583 Coronary atherosclerosis due to lipid rich plaque: Secondary | ICD-10-CM | POA: Diagnosis not present

## 2015-11-30 DIAGNOSIS — I2510 Atherosclerotic heart disease of native coronary artery without angina pectoris: Secondary | ICD-10-CM | POA: Diagnosis not present

## 2015-11-30 DIAGNOSIS — I10 Essential (primary) hypertension: Secondary | ICD-10-CM | POA: Diagnosis not present

## 2015-12-08 DIAGNOSIS — I2583 Coronary atherosclerosis due to lipid rich plaque: Secondary | ICD-10-CM | POA: Diagnosis not present

## 2015-12-08 DIAGNOSIS — C679 Malignant neoplasm of bladder, unspecified: Secondary | ICD-10-CM | POA: Diagnosis not present

## 2015-12-08 DIAGNOSIS — I2510 Atherosclerotic heart disease of native coronary artery without angina pectoris: Secondary | ICD-10-CM | POA: Diagnosis not present

## 2015-12-25 ENCOUNTER — Ambulatory Visit (INDEPENDENT_AMBULATORY_CARE_PROVIDER_SITE_OTHER): Payer: Commercial Managed Care - HMO | Admitting: Sports Medicine

## 2015-12-25 ENCOUNTER — Encounter: Payer: Self-pay | Admitting: Sports Medicine

## 2015-12-25 VITALS — BP 158/65 | HR 64 | Resp 16 | Wt 142.5 lb

## 2015-12-25 DIAGNOSIS — M1711 Unilateral primary osteoarthritis, right knee: Secondary | ICD-10-CM | POA: Diagnosis not present

## 2015-12-25 DIAGNOSIS — C679 Malignant neoplasm of bladder, unspecified: Secondary | ICD-10-CM | POA: Diagnosis not present

## 2015-12-25 DIAGNOSIS — D649 Anemia, unspecified: Secondary | ICD-10-CM | POA: Diagnosis not present

## 2015-12-25 LAB — POCT HEMOGLOBIN: Hemoglobin: 7.5 g/dL — AB (ref 14.1–18.1)

## 2015-12-25 MED ORDER — HYDROCODONE-ACETAMINOPHEN 7.5-325 MG PO TABS: 1.0000 | ORAL_TABLET | Freq: Three times a day (TID) | ORAL | 0 refills | Status: DC | PRN

## 2015-12-25 NOTE — Progress Notes (Signed)
  Subjective:    CC: Follow-up  HPI: This is a pleasant 82 year old male, we did uncover a bladder neoplasm, he is plugged into urology and hematology oncology, ultimately the decision was made not to proceed with cystectomy, he will simply have surveillance, local tumor debulking, no chemotherapy. Unfortunately he has been significantly anemic, studies showed iron deficiency, but anemia was normocytic suggesting bone marrow failure. He does have an appoint coming up with hematology oncology, up to this point he has not had a significant increase in his hemoglobin or reticulocytosis with aggressive iron supplementation.  Past medical history:  Negative.  See flowsheet/record as well for more information.  Surgical history: Negative.  See flowsheet/record as well for more information.  Family history: Negative.  See flowsheet/record as well for more information.  Social history: Negative.  See flowsheet/record as well for more information.  Allergies, and medications have been entered into the medical record, reviewed, and no changes needed.   Review of Systems: No fevers, chills, night sweats, weight loss, chest pain, or shortness of breath.   Objective:    General: Well Developed, well nourished, and in no acute distress.  Neuro: Alert and oriented x3, extra-ocular muscles intact, sensation grossly intact.  HEENT: Normocephalic, atraumatic, pupils equal round reactive to light, neck supple, no masses, no lymphadenopathy, thyroid nonpalpable.  Skin: Warm and dry, no rashes. Cardiac: Regular rate and rhythm, no murmurs rubs or gallops, no lower extremity edema.  Respiratory: Clear to auscultation bilaterally. Not using accessory muscles, speaking in full sentences.  Impression and Recommendations:    Normocytic anemia Unfortunately normocytic anemia has worsened. He does have an appointment coming up with his oncologist, I'm going to give him a copy of his iron indices, he likely needs  both erythropoietin/Asanesp and intravenous iron, has been doing iron oral 3 times a day without sufficient improvement.  Bladder cancer [facility]) Urology and oncology are planning simply observing with occasional debridement and tumor debulking. No chemotherapy, no surgery.  I spent 25 minutes with this patient, greater than 50% was face-to-face time counseling regarding the above diagnoses

## 2015-12-25 NOTE — Assessment & Plan Note (Signed)
Urology and oncology are planning simply observing with occasional debridement and tumor debulking. No chemotherapy, no surgery.

## 2015-12-25 NOTE — Assessment & Plan Note (Signed)
Unfortunately normocytic anemia has worsened. He does have an appointment coming up with his oncologist, I'm going to give him a copy of his iron indices, he likely needs both erythropoietin/Asanesp and intravenous iron, has been doing iron oral 3 times a day without sufficient improvement.

## 2015-12-29 DIAGNOSIS — N1330 Unspecified hydronephrosis: Secondary | ICD-10-CM | POA: Diagnosis not present

## 2015-12-29 DIAGNOSIS — I252 Old myocardial infarction: Secondary | ICD-10-CM | POA: Diagnosis not present

## 2015-12-29 DIAGNOSIS — G893 Neoplasm related pain (acute) (chronic): Secondary | ICD-10-CM | POA: Diagnosis not present

## 2015-12-29 DIAGNOSIS — C679 Malignant neoplasm of bladder, unspecified: Secondary | ICD-10-CM | POA: Diagnosis not present

## 2015-12-29 DIAGNOSIS — R7989 Other specified abnormal findings of blood chemistry: Secondary | ICD-10-CM | POA: Diagnosis not present

## 2015-12-29 DIAGNOSIS — E785 Hyperlipidemia, unspecified: Secondary | ICD-10-CM | POA: Diagnosis not present

## 2015-12-29 DIAGNOSIS — D509 Iron deficiency anemia, unspecified: Secondary | ICD-10-CM | POA: Diagnosis not present

## 2015-12-29 DIAGNOSIS — I2510 Atherosclerotic heart disease of native coronary artery without angina pectoris: Secondary | ICD-10-CM | POA: Diagnosis not present

## 2015-12-29 DIAGNOSIS — K5900 Constipation, unspecified: Secondary | ICD-10-CM | POA: Diagnosis not present

## 2015-12-29 DIAGNOSIS — Z8546 Personal history of malignant neoplasm of prostate: Secondary | ICD-10-CM | POA: Diagnosis not present

## 2015-12-29 DIAGNOSIS — Z9079 Acquired absence of other genital organ(s): Secondary | ICD-10-CM | POA: Diagnosis not present

## 2015-12-29 DIAGNOSIS — Z9889 Other specified postprocedural states: Secondary | ICD-10-CM | POA: Diagnosis not present

## 2016-01-02 ENCOUNTER — Other Ambulatory Visit: Payer: Self-pay | Admitting: Sports Medicine

## 2016-01-15 DIAGNOSIS — I2510 Atherosclerotic heart disease of native coronary artery without angina pectoris: Secondary | ICD-10-CM | POA: Diagnosis not present

## 2016-01-15 DIAGNOSIS — Z8551 Personal history of malignant neoplasm of bladder: Secondary | ICD-10-CM | POA: Diagnosis not present

## 2016-01-15 DIAGNOSIS — N1330 Unspecified hydronephrosis: Secondary | ICD-10-CM | POA: Diagnosis not present

## 2016-01-15 DIAGNOSIS — K8020 Calculus of gallbladder without cholecystitis without obstruction: Secondary | ICD-10-CM | POA: Diagnosis not present

## 2016-01-15 DIAGNOSIS — N3080 Other cystitis without hematuria: Secondary | ICD-10-CM | POA: Diagnosis not present

## 2016-01-15 DIAGNOSIS — G4730 Sleep apnea, unspecified: Secondary | ICD-10-CM | POA: Diagnosis not present

## 2016-01-15 DIAGNOSIS — Z888 Allergy status to other drugs, medicaments and biological substances status: Secondary | ICD-10-CM | POA: Diagnosis not present

## 2016-01-15 DIAGNOSIS — I252 Old myocardial infarction: Secondary | ICD-10-CM | POA: Diagnosis not present

## 2016-01-15 DIAGNOSIS — I10 Essential (primary) hypertension: Secondary | ICD-10-CM | POA: Diagnosis not present

## 2016-01-15 DIAGNOSIS — C679 Malignant neoplasm of bladder, unspecified: Secondary | ICD-10-CM | POA: Diagnosis not present

## 2016-01-15 DIAGNOSIS — R569 Unspecified convulsions: Secondary | ICD-10-CM | POA: Diagnosis not present

## 2016-01-17 DIAGNOSIS — C679 Malignant neoplasm of bladder, unspecified: Secondary | ICD-10-CM | POA: Diagnosis not present

## 2016-02-03 ENCOUNTER — Other Ambulatory Visit: Payer: Self-pay | Admitting: Sports Medicine

## 2016-02-20 DIAGNOSIS — G893 Neoplasm related pain (acute) (chronic): Secondary | ICD-10-CM | POA: Diagnosis not present

## 2016-02-20 DIAGNOSIS — C679 Malignant neoplasm of bladder, unspecified: Secondary | ICD-10-CM | POA: Diagnosis not present

## 2016-02-20 DIAGNOSIS — Z8546 Personal history of malignant neoplasm of prostate: Secondary | ICD-10-CM | POA: Diagnosis not present

## 2016-02-20 DIAGNOSIS — N1330 Unspecified hydronephrosis: Secondary | ICD-10-CM | POA: Diagnosis not present

## 2016-02-20 DIAGNOSIS — D509 Iron deficiency anemia, unspecified: Secondary | ICD-10-CM | POA: Diagnosis not present

## 2016-02-20 DIAGNOSIS — E785 Hyperlipidemia, unspecified: Secondary | ICD-10-CM | POA: Diagnosis not present

## 2016-02-20 DIAGNOSIS — R748 Abnormal levels of other serum enzymes: Secondary | ICD-10-CM | POA: Diagnosis not present

## 2016-02-20 DIAGNOSIS — D649 Anemia, unspecified: Secondary | ICD-10-CM | POA: Diagnosis not present

## 2016-02-20 DIAGNOSIS — R7989 Other specified abnormal findings of blood chemistry: Secondary | ICD-10-CM | POA: Diagnosis not present

## 2016-02-20 DIAGNOSIS — I2510 Atherosclerotic heart disease of native coronary artery without angina pectoris: Secondary | ICD-10-CM | POA: Diagnosis not present

## 2016-02-20 DIAGNOSIS — Z9079 Acquired absence of other genital organ(s): Secondary | ICD-10-CM | POA: Diagnosis not present

## 2016-02-20 DIAGNOSIS — Z85828 Personal history of other malignant neoplasm of skin: Secondary | ICD-10-CM | POA: Diagnosis not present

## 2016-02-25 ENCOUNTER — Other Ambulatory Visit: Payer: Self-pay | Admitting: Sports Medicine

## 2016-03-09 ENCOUNTER — Other Ambulatory Visit: Payer: Self-pay | Admitting: Sports Medicine

## 2016-03-15 ENCOUNTER — Other Ambulatory Visit: Payer: Self-pay | Admitting: Sports Medicine

## 2016-03-15 DIAGNOSIS — D649 Anemia, unspecified: Secondary | ICD-10-CM

## 2016-03-15 MED ORDER — FERROUS SULFATE 325 (65 FE) MG PO TBEC: 325.0000 mg | DELAYED_RELEASE_TABLET | Freq: Three times a day (TID) | ORAL | 3 refills | Status: DC

## 2016-03-19 ENCOUNTER — Other Ambulatory Visit: Payer: Self-pay | Admitting: Sports Medicine

## 2016-03-20 ENCOUNTER — Telehealth: Payer: Self-pay

## 2016-03-20 DIAGNOSIS — C4490 Unspecified malignant neoplasm of skin, unspecified: Secondary | ICD-10-CM

## 2016-03-20 NOTE — Telephone Encounter (Signed)
Pt left VM stating he has a spot on his nose that is not healing and bleeding. Would like to know if he can get a referral back to Dr. Baltazar Najjar the Dermatologist. Please assist.

## 2016-03-21 NOTE — Telephone Encounter (Signed)
I am happy to do the excision/shave biopsy and hyfrecation here if he would like.

## 2016-03-21 NOTE — Telephone Encounter (Signed)
Spoke with pt's wife who states the lesion is really deep and pt may need further treatment and would rather do the referral.

## 2016-03-21 NOTE — Telephone Encounter (Signed)
No problem. Referral placed.

## 2016-04-01 ENCOUNTER — Other Ambulatory Visit: Payer: Self-pay | Admitting: Sports Medicine

## 2016-04-08 ENCOUNTER — Telehealth: Payer: Self-pay

## 2016-04-08 DIAGNOSIS — M1711 Unilateral primary osteoarthritis, right knee: Secondary | ICD-10-CM

## 2016-04-08 MED ORDER — HYDROCODONE-ACETAMINOPHEN 7.5-325 MG PO TABS: 1.0000 | ORAL_TABLET | Freq: Three times a day (TID) | ORAL | 0 refills | Status: DC | PRN

## 2016-04-08 NOTE — Telephone Encounter (Signed)
Pt would like a refill of hydrocodone. Please advise.

## 2016-04-10 ENCOUNTER — Other Ambulatory Visit: Payer: Self-pay | Admitting: Sports Medicine

## 2016-04-17 DIAGNOSIS — L570 Actinic keratosis: Secondary | ICD-10-CM | POA: Diagnosis not present

## 2016-04-17 DIAGNOSIS — C44311 Basal cell carcinoma of skin of nose: Secondary | ICD-10-CM | POA: Diagnosis not present

## 2016-04-25 ENCOUNTER — Other Ambulatory Visit: Payer: Self-pay | Admitting: Sports Medicine

## 2016-05-20 DIAGNOSIS — M1990 Unspecified osteoarthritis, unspecified site: Secondary | ICD-10-CM | POA: Diagnosis not present

## 2016-05-20 DIAGNOSIS — R7881 Bacteremia: Secondary | ICD-10-CM | POA: Diagnosis not present

## 2016-05-20 DIAGNOSIS — K769 Liver disease, unspecified: Secondary | ICD-10-CM | POA: Diagnosis not present

## 2016-05-20 DIAGNOSIS — I110 Hypertensive heart disease with heart failure: Secondary | ICD-10-CM | POA: Diagnosis not present

## 2016-05-20 DIAGNOSIS — R5082 Postprocedural fever: Secondary | ICD-10-CM | POA: Diagnosis not present

## 2016-05-20 DIAGNOSIS — D494 Neoplasm of unspecified behavior of bladder: Secondary | ICD-10-CM | POA: Diagnosis not present

## 2016-05-20 DIAGNOSIS — N135 Crossing vessel and stricture of ureter without hydronephrosis: Secondary | ICD-10-CM | POA: Diagnosis not present

## 2016-05-20 DIAGNOSIS — C679 Malignant neoplasm of bladder, unspecified: Secondary | ICD-10-CM | POA: Diagnosis not present

## 2016-05-20 DIAGNOSIS — E785 Hyperlipidemia, unspecified: Secondary | ICD-10-CM | POA: Diagnosis not present

## 2016-05-20 DIAGNOSIS — N1330 Unspecified hydronephrosis: Secondary | ICD-10-CM | POA: Diagnosis not present

## 2016-05-20 DIAGNOSIS — I252 Old myocardial infarction: Secondary | ICD-10-CM | POA: Diagnosis not present

## 2016-05-20 DIAGNOSIS — K8020 Calculus of gallbladder without cholecystitis without obstruction: Secondary | ICD-10-CM | POA: Diagnosis not present

## 2016-05-20 DIAGNOSIS — Z8546 Personal history of malignant neoplasm of prostate: Secondary | ICD-10-CM | POA: Diagnosis not present

## 2016-05-20 DIAGNOSIS — Z9989 Dependence on other enabling machines and devices: Secondary | ICD-10-CM | POA: Diagnosis not present

## 2016-05-20 DIAGNOSIS — I10 Essential (primary) hypertension: Secondary | ICD-10-CM | POA: Diagnosis not present

## 2016-05-20 DIAGNOSIS — G40909 Epilepsy, unspecified, not intractable, without status epilepticus: Secondary | ICD-10-CM | POA: Diagnosis not present

## 2016-05-20 DIAGNOSIS — I5032 Chronic diastolic (congestive) heart failure: Secondary | ICD-10-CM | POA: Diagnosis not present

## 2016-05-20 DIAGNOSIS — R509 Fever, unspecified: Secondary | ICD-10-CM | POA: Diagnosis not present

## 2016-05-20 DIAGNOSIS — Z9889 Other specified postprocedural states: Secondary | ICD-10-CM | POA: Diagnosis not present

## 2016-05-20 DIAGNOSIS — A419 Sepsis, unspecified organism: Secondary | ICD-10-CM | POA: Diagnosis not present

## 2016-05-20 DIAGNOSIS — A4181 Sepsis due to Enterococcus: Secondary | ICD-10-CM | POA: Diagnosis not present

## 2016-05-20 DIAGNOSIS — D649 Anemia, unspecified: Secondary | ICD-10-CM | POA: Diagnosis not present

## 2016-05-20 DIAGNOSIS — N390 Urinary tract infection, site not specified: Secondary | ICD-10-CM | POA: Diagnosis not present

## 2016-05-20 DIAGNOSIS — G4733 Obstructive sleep apnea (adult) (pediatric): Secondary | ICD-10-CM | POA: Diagnosis not present

## 2016-05-20 DIAGNOSIS — I5030 Unspecified diastolic (congestive) heart failure: Secondary | ICD-10-CM | POA: Diagnosis not present

## 2016-05-20 DIAGNOSIS — K5730 Diverticulosis of large intestine without perforation or abscess without bleeding: Secondary | ICD-10-CM | POA: Diagnosis not present

## 2016-05-20 DIAGNOSIS — Z951 Presence of aortocoronary bypass graft: Secondary | ICD-10-CM | POA: Diagnosis not present

## 2016-05-20 DIAGNOSIS — Z87891 Personal history of nicotine dependence: Secondary | ICD-10-CM | POA: Diagnosis not present

## 2016-05-20 DIAGNOSIS — B952 Enterococcus as the cause of diseases classified elsewhere: Secondary | ICD-10-CM | POA: Diagnosis not present

## 2016-05-20 DIAGNOSIS — T83592A Infection and inflammatory reaction due to indwelling ureteral stent, initial encounter: Secondary | ICD-10-CM | POA: Diagnosis not present

## 2016-05-20 DIAGNOSIS — I2510 Atherosclerotic heart disease of native coronary artery without angina pectoris: Secondary | ICD-10-CM | POA: Diagnosis not present

## 2016-05-20 DIAGNOSIS — N12 Tubulo-interstitial nephritis, not specified as acute or chronic: Secondary | ICD-10-CM | POA: Diagnosis not present

## 2016-05-20 DIAGNOSIS — Z886 Allergy status to analgesic agent status: Secondary | ICD-10-CM | POA: Diagnosis not present

## 2016-05-20 DIAGNOSIS — C61 Malignant neoplasm of prostate: Secondary | ICD-10-CM | POA: Diagnosis not present

## 2016-05-20 DIAGNOSIS — Z48816 Encounter for surgical aftercare following surgery on the genitourinary system: Secondary | ICD-10-CM | POA: Diagnosis not present

## 2016-05-20 DIAGNOSIS — Z452 Encounter for adjustment and management of vascular access device: Secondary | ICD-10-CM | POA: Diagnosis not present

## 2016-05-25 DIAGNOSIS — I2510 Atherosclerotic heart disease of native coronary artery without angina pectoris: Secondary | ICD-10-CM | POA: Diagnosis not present

## 2016-05-25 DIAGNOSIS — G40909 Epilepsy, unspecified, not intractable, without status epilepticus: Secondary | ICD-10-CM | POA: Diagnosis not present

## 2016-05-25 DIAGNOSIS — A4181 Sepsis due to Enterococcus: Secondary | ICD-10-CM | POA: Diagnosis not present

## 2016-05-25 DIAGNOSIS — N390 Urinary tract infection, site not specified: Secondary | ICD-10-CM | POA: Diagnosis not present

## 2016-05-25 DIAGNOSIS — I110 Hypertensive heart disease with heart failure: Secondary | ICD-10-CM | POA: Diagnosis not present

## 2016-05-25 DIAGNOSIS — N1330 Unspecified hydronephrosis: Secondary | ICD-10-CM | POA: Diagnosis not present

## 2016-05-25 DIAGNOSIS — D649 Anemia, unspecified: Secondary | ICD-10-CM | POA: Diagnosis not present

## 2016-05-25 DIAGNOSIS — I5030 Unspecified diastolic (congestive) heart failure: Secondary | ICD-10-CM | POA: Diagnosis not present

## 2016-05-25 DIAGNOSIS — Z48816 Encounter for surgical aftercare following surgery on the genitourinary system: Secondary | ICD-10-CM | POA: Diagnosis not present

## 2016-05-26 DIAGNOSIS — I5030 Unspecified diastolic (congestive) heart failure: Secondary | ICD-10-CM | POA: Diagnosis not present

## 2016-05-26 DIAGNOSIS — N390 Urinary tract infection, site not specified: Secondary | ICD-10-CM | POA: Diagnosis not present

## 2016-05-26 DIAGNOSIS — G40909 Epilepsy, unspecified, not intractable, without status epilepticus: Secondary | ICD-10-CM | POA: Diagnosis not present

## 2016-05-26 DIAGNOSIS — A4181 Sepsis due to Enterococcus: Secondary | ICD-10-CM | POA: Diagnosis not present

## 2016-05-26 DIAGNOSIS — Z48816 Encounter for surgical aftercare following surgery on the genitourinary system: Secondary | ICD-10-CM | POA: Diagnosis not present

## 2016-05-26 DIAGNOSIS — N1330 Unspecified hydronephrosis: Secondary | ICD-10-CM | POA: Diagnosis not present

## 2016-05-26 DIAGNOSIS — I2510 Atherosclerotic heart disease of native coronary artery without angina pectoris: Secondary | ICD-10-CM | POA: Diagnosis not present

## 2016-05-26 DIAGNOSIS — I110 Hypertensive heart disease with heart failure: Secondary | ICD-10-CM | POA: Diagnosis not present

## 2016-05-26 DIAGNOSIS — D649 Anemia, unspecified: Secondary | ICD-10-CM | POA: Diagnosis not present

## 2016-05-27 DIAGNOSIS — A4181 Sepsis due to Enterococcus: Secondary | ICD-10-CM | POA: Diagnosis not present

## 2016-05-27 DIAGNOSIS — I5030 Unspecified diastolic (congestive) heart failure: Secondary | ICD-10-CM | POA: Diagnosis not present

## 2016-05-27 DIAGNOSIS — I110 Hypertensive heart disease with heart failure: Secondary | ICD-10-CM | POA: Diagnosis not present

## 2016-05-27 DIAGNOSIS — Z48816 Encounter for surgical aftercare following surgery on the genitourinary system: Secondary | ICD-10-CM | POA: Diagnosis not present

## 2016-05-27 DIAGNOSIS — N390 Urinary tract infection, site not specified: Secondary | ICD-10-CM | POA: Diagnosis not present

## 2016-05-27 DIAGNOSIS — I2510 Atherosclerotic heart disease of native coronary artery without angina pectoris: Secondary | ICD-10-CM | POA: Diagnosis not present

## 2016-05-27 DIAGNOSIS — N1330 Unspecified hydronephrosis: Secondary | ICD-10-CM | POA: Diagnosis not present

## 2016-05-27 DIAGNOSIS — G40909 Epilepsy, unspecified, not intractable, without status epilepticus: Secondary | ICD-10-CM | POA: Diagnosis not present

## 2016-05-27 DIAGNOSIS — D649 Anemia, unspecified: Secondary | ICD-10-CM | POA: Diagnosis not present

## 2016-05-28 ENCOUNTER — Other Ambulatory Visit: Payer: Self-pay

## 2016-05-28 DIAGNOSIS — I5030 Unspecified diastolic (congestive) heart failure: Secondary | ICD-10-CM | POA: Diagnosis not present

## 2016-05-28 DIAGNOSIS — G40909 Epilepsy, unspecified, not intractable, without status epilepticus: Secondary | ICD-10-CM | POA: Diagnosis not present

## 2016-05-28 DIAGNOSIS — A4181 Sepsis due to Enterococcus: Secondary | ICD-10-CM | POA: Diagnosis not present

## 2016-05-28 DIAGNOSIS — D649 Anemia, unspecified: Secondary | ICD-10-CM | POA: Diagnosis not present

## 2016-05-28 DIAGNOSIS — I2510 Atherosclerotic heart disease of native coronary artery without angina pectoris: Secondary | ICD-10-CM | POA: Diagnosis not present

## 2016-05-28 DIAGNOSIS — N1330 Unspecified hydronephrosis: Secondary | ICD-10-CM | POA: Diagnosis not present

## 2016-05-28 DIAGNOSIS — N390 Urinary tract infection, site not specified: Secondary | ICD-10-CM | POA: Diagnosis not present

## 2016-05-28 DIAGNOSIS — A419 Sepsis, unspecified organism: Secondary | ICD-10-CM | POA: Diagnosis not present

## 2016-05-28 DIAGNOSIS — I110 Hypertensive heart disease with heart failure: Secondary | ICD-10-CM | POA: Diagnosis not present

## 2016-05-28 DIAGNOSIS — Z48816 Encounter for surgical aftercare following surgery on the genitourinary system: Secondary | ICD-10-CM | POA: Diagnosis not present

## 2016-05-28 NOTE — Patient Outreach (Signed)
[facility]) Care Management  05/28/2016  JADIE COMAS 11-06-1927 [id number]  Transition of care  Week # 1 Referral date: 05/28/16 Referral source: Status post hospital discharge on 05/25/16 from [place] [facility] Program:  OTHER: Bacterial infection status post stent placement for bladder neoplasm Insurance: [employer] Providers: Dr. Aundria Mems, primary MD Dr. Olean Ree - Gastroenterology Dr. Tommi Rumps- surgeon Social support:  Lives with spouse and disabled son.  Has Lucianne Lei transportation assistance.    SUBJECTIVE: Telephone call to patient regarding transition of care.  HIPAA verified with patient. Discussed transition of care program with patient. Patient verbally agreed to telephone follow up.  Patient states he had stents placed in his bladder due to bladder cancer on Monday 05/20/16. Patient reports that day he had a temp of 102 and was told by surgeon to go to the emergency room.  Patient reports he was admitted to the hospital for antibiotic treatment. Patient confirms he was discharged on 05/25/16 from [place] center.   Patient states he is having home antibiotic therapy with [facility]. Patient states he does have a PICC line. RNCM discussed signs/ symptoms of infection to be aware of. Advised to report these symptoms immediately to his doctor.  Patient states he has transportation to his appointment. Patient states he is working on setting up his follow up appointments with his surgeon and primary MD today.   Patient states he has other calls coming in today and requested to end phone call with RNCM. Patient requested he be called later in the week to complete call.   PLAN: RNCM will follow up with patient within 1 week.  RNCM will send patient [facility] care management welcome packet/ consent form. RNCM will send involvement letter to patients primary MD.  EMMI education material: How to prevent  surgical site infections   Quinn Plowman RN,BSN,CCM [facility] Telephonic  [phone]

## 2016-05-30 DIAGNOSIS — Z48816 Encounter for surgical aftercare following surgery on the genitourinary system: Secondary | ICD-10-CM | POA: Diagnosis not present

## 2016-05-30 DIAGNOSIS — G40909 Epilepsy, unspecified, not intractable, without status epilepticus: Secondary | ICD-10-CM | POA: Diagnosis not present

## 2016-05-30 DIAGNOSIS — A4181 Sepsis due to Enterococcus: Secondary | ICD-10-CM | POA: Diagnosis not present

## 2016-05-30 DIAGNOSIS — N1330 Unspecified hydronephrosis: Secondary | ICD-10-CM | POA: Diagnosis not present

## 2016-05-30 DIAGNOSIS — D649 Anemia, unspecified: Secondary | ICD-10-CM | POA: Diagnosis not present

## 2016-05-30 DIAGNOSIS — N390 Urinary tract infection, site not specified: Secondary | ICD-10-CM | POA: Diagnosis not present

## 2016-05-30 DIAGNOSIS — I110 Hypertensive heart disease with heart failure: Secondary | ICD-10-CM | POA: Diagnosis not present

## 2016-05-30 DIAGNOSIS — I5030 Unspecified diastolic (congestive) heart failure: Secondary | ICD-10-CM | POA: Diagnosis not present

## 2016-05-30 DIAGNOSIS — I2510 Atherosclerotic heart disease of native coronary artery without angina pectoris: Secondary | ICD-10-CM | POA: Diagnosis not present

## 2016-05-31 ENCOUNTER — Other Ambulatory Visit: Payer: Self-pay

## 2016-05-31 NOTE — Patient Outreach (Signed)
[facility]) Care Management  05/31/2016  Jeffery Hart Oct 08, 1927 [id number]  Transition of care  Week # 1 Referral date: 05/28/16 Referral source: Status post hospital discharge on 05/25/16 from [place] [facility] Program:  OTHER: Bacterial infection status post stent placement for bladder neoplasm Insurance: [employer] Providers: Dr. Aundria Mems, primary MD Dr. Olean Ree - Gastroenterology Dr. Tommi Rumps- surgeon Social support:  Lives with spouse and disabled son.  Has Lucianne Lei transportation assistance.  VERBAL CONSENT; Patient gave verbal authorization to speak with his wife, Jeffery Hart regarding all of her personal health information.   SUBJECTIVE: Telephone call to patient regarding transition of care follow up. HIPAA verified with patient. Patient states he is currently having lunch and would not be a good time to talk.  Patient states his wife could talk for a few minutes to complete transition of care.  Patients wife states patient continues with home health services for his infusion. 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 Wife states patient has lymphedema in his right leg and a small amount of swelling in his left that is being monitored. Wife states patient does not have any signs of infection status post his procedure.  Reports PICC line site looks good.  Wife states patient has a sodium deficiency. States patient takes 3 salt tablets per day and one of his medications is in saline.  RNCM advised patient/ wife to notify MD of any changes in condition prior to scheduled appointment. RNCM provided contact name and number: [phone] or main office number [phone] and 24 hour nurse advise line [phone].  RNCM verified patient/ wife aware of 911 services for urgent/ emergent needs.  PLAN:  RNCM will follow up with patient  And/ or spouse within 1 week.   Quinn Plowman RN,BSN,CCM [facility] Telephonic   [phone]

## 2016-06-02 DIAGNOSIS — A419 Sepsis, unspecified organism: Secondary | ICD-10-CM | POA: Diagnosis not present

## 2016-06-03 DIAGNOSIS — I5030 Unspecified diastolic (congestive) heart failure: Secondary | ICD-10-CM | POA: Diagnosis not present

## 2016-06-03 DIAGNOSIS — I2510 Atherosclerotic heart disease of native coronary artery without angina pectoris: Secondary | ICD-10-CM | POA: Diagnosis not present

## 2016-06-03 DIAGNOSIS — N1330 Unspecified hydronephrosis: Secondary | ICD-10-CM | POA: Diagnosis not present

## 2016-06-03 DIAGNOSIS — I110 Hypertensive heart disease with heart failure: Secondary | ICD-10-CM | POA: Diagnosis not present

## 2016-06-03 DIAGNOSIS — D649 Anemia, unspecified: Secondary | ICD-10-CM | POA: Diagnosis not present

## 2016-06-03 DIAGNOSIS — G40909 Epilepsy, unspecified, not intractable, without status epilepticus: Secondary | ICD-10-CM | POA: Diagnosis not present

## 2016-06-03 DIAGNOSIS — A4181 Sepsis due to Enterococcus: Secondary | ICD-10-CM | POA: Diagnosis not present

## 2016-06-03 DIAGNOSIS — Z48816 Encounter for surgical aftercare following surgery on the genitourinary system: Secondary | ICD-10-CM | POA: Diagnosis not present

## 2016-06-03 DIAGNOSIS — N390 Urinary tract infection, site not specified: Secondary | ICD-10-CM | POA: Diagnosis not present

## 2016-06-04 ENCOUNTER — Other Ambulatory Visit: Payer: Self-pay

## 2016-06-04 NOTE — Patient Outreach (Signed)
[facility]) Care Management  06/04/2016  Jeffery Hart 12/28/1927 [id number]  Transition of care  Week # 1 Referral date: 05/28/16 Referral source: Status post hospital discharge on 05/25/16 from [facility] Program: OTHER: Bacterial infection status post stent placement for bladder neoplasm Insurance: [employer] Providers: Dr. Aundria Mems, primary MD Dr. Olean Ree - Gastroenterology Dr. Tommi Rumps- surgeon Social support: Lives with spouse and disabled son. Has Lucianne Lei transportation assistance.  VERBAL CONSENT; Patient gave verbal authorization to speak with his wife, [name] regarding all of her personal health information.  Outreach attempt #1  Telephone call to patient. Unable to reach. HIPAA compliant voice message left with call back phone number.   PLAN; RNCM will attempt 2nd outreach to patient within 1 week.   Quinn Plowman RN,BSN,CCM [facility] Telephonic  [phone]

## 2016-06-06 ENCOUNTER — Other Ambulatory Visit: Payer: Self-pay

## 2016-06-06 DIAGNOSIS — I2510 Atherosclerotic heart disease of native coronary artery without angina pectoris: Secondary | ICD-10-CM | POA: Diagnosis not present

## 2016-06-06 DIAGNOSIS — G40909 Epilepsy, unspecified, not intractable, without status epilepticus: Secondary | ICD-10-CM | POA: Diagnosis not present

## 2016-06-06 DIAGNOSIS — D649 Anemia, unspecified: Secondary | ICD-10-CM | POA: Diagnosis not present

## 2016-06-06 DIAGNOSIS — N390 Urinary tract infection, site not specified: Secondary | ICD-10-CM | POA: Diagnosis not present

## 2016-06-06 DIAGNOSIS — I5030 Unspecified diastolic (congestive) heart failure: Secondary | ICD-10-CM | POA: Diagnosis not present

## 2016-06-06 DIAGNOSIS — A4181 Sepsis due to Enterococcus: Secondary | ICD-10-CM | POA: Diagnosis not present

## 2016-06-06 DIAGNOSIS — I110 Hypertensive heart disease with heart failure: Secondary | ICD-10-CM | POA: Diagnosis not present

## 2016-06-06 DIAGNOSIS — N1330 Unspecified hydronephrosis: Secondary | ICD-10-CM | POA: Diagnosis not present

## 2016-06-06 DIAGNOSIS — Z48816 Encounter for surgical aftercare following surgery on the genitourinary system: Secondary | ICD-10-CM | POA: Diagnosis not present

## 2016-06-06 NOTE — Patient Outreach (Signed)
[facility]) Care Management  06/06/2016  LAW CORSINO 08-19-1927 [id number]  Transition of care  Referral date: 05/28/16 Referral source: Status post hospital discharge on 05/25/16 from [place] center Program: OTHER: Bacterial infection status post stent placement for bladder neoplasm Insurance: [employer] Providers: Dr. Aundria Mems, primary MD Dr. Olean Ree - Gastroenterology Dr. Tommi Rumps- surgeon Social support: Lives with spouse and disabled son. Has Lucianne Lei transportation assistance.  VERBAL CONSENT; Patient gave verbal authorization to speak with his wife, Sadiel Mota regarding all of her personal health information.  Outreach attempt #2  Telephone call to patient. Unable to reach. HIPAA compliant voice message left with call back phone number.   PLAN; RNCM will attempt 3rd outreach to patient within 1 week.   Quinn Plowman RN,BSN,CCM [facility] Telephonic  [phone]

## 2016-06-08 DIAGNOSIS — A419 Sepsis, unspecified organism: Secondary | ICD-10-CM | POA: Diagnosis not present

## 2016-06-09 ENCOUNTER — Other Ambulatory Visit: Payer: Self-pay | Admitting: Sports Medicine

## 2016-06-09 DIAGNOSIS — D649 Anemia, unspecified: Secondary | ICD-10-CM

## 2016-06-10 DIAGNOSIS — A419 Sepsis, unspecified organism: Secondary | ICD-10-CM | POA: Diagnosis not present

## 2016-06-10 DIAGNOSIS — A4181 Sepsis due to Enterococcus: Secondary | ICD-10-CM | POA: Diagnosis not present

## 2016-06-10 DIAGNOSIS — N390 Urinary tract infection, site not specified: Secondary | ICD-10-CM | POA: Diagnosis not present

## 2016-06-10 DIAGNOSIS — I2510 Atherosclerotic heart disease of native coronary artery without angina pectoris: Secondary | ICD-10-CM | POA: Diagnosis not present

## 2016-06-10 DIAGNOSIS — Z48816 Encounter for surgical aftercare following surgery on the genitourinary system: Secondary | ICD-10-CM | POA: Diagnosis not present

## 2016-06-10 DIAGNOSIS — I110 Hypertensive heart disease with heart failure: Secondary | ICD-10-CM | POA: Diagnosis not present

## 2016-06-10 DIAGNOSIS — D649 Anemia, unspecified: Secondary | ICD-10-CM | POA: Diagnosis not present

## 2016-06-10 DIAGNOSIS — I5030 Unspecified diastolic (congestive) heart failure: Secondary | ICD-10-CM | POA: Diagnosis not present

## 2016-06-10 DIAGNOSIS — G40909 Epilepsy, unspecified, not intractable, without status epilepticus: Secondary | ICD-10-CM | POA: Diagnosis not present

## 2016-06-10 DIAGNOSIS — N1330 Unspecified hydronephrosis: Secondary | ICD-10-CM | POA: Diagnosis not present

## 2016-06-11 ENCOUNTER — Ambulatory Visit: Payer: Self-pay

## 2016-06-12 DIAGNOSIS — A419 Sepsis, unspecified organism: Secondary | ICD-10-CM | POA: Diagnosis not present

## 2016-06-12 DIAGNOSIS — N135 Crossing vessel and stricture of ureter without hydronephrosis: Secondary | ICD-10-CM | POA: Diagnosis not present

## 2016-06-12 DIAGNOSIS — A4181 Sepsis due to Enterococcus: Secondary | ICD-10-CM | POA: Diagnosis not present

## 2016-06-12 DIAGNOSIS — I5032 Chronic diastolic (congestive) heart failure: Secondary | ICD-10-CM | POA: Diagnosis not present

## 2016-06-12 DIAGNOSIS — N390 Urinary tract infection, site not specified: Secondary | ICD-10-CM | POA: Diagnosis not present

## 2016-06-13 ENCOUNTER — Other Ambulatory Visit: Payer: Self-pay

## 2016-06-13 NOTE — Patient Outreach (Signed)
[facility] [facility]) Care Management  06/13/2016  Jeffery Hart 1927/01/31 [id number]   Transition of care  Referral date: 05/28/16 Referral source: Status post hospital discharge on 05/25/16 from [place] [facility] Program: OTHER: Bacterial infection status post stent placement for bladder neoplasm Insurance: [employer] Providers: Dr. Aundria Mems, primary MD Dr. Olean Ree - Gastroenterology Dr. Tommi Rumps- surgeon Social support: Lives with spouse and disabled son. Has Lucianne Lei transportation assistance.  VERBAL CONSENT; Patient gave verbal authorization to speak with his wife, Jeffery Hart regarding all of her personal health information.  Outreach attempt #3  Third telephone call to patient. Unable to reach. HIPAA compliant voice message left with call back phone number.   PLAN; RNCM will send outreach letter to patient to attempt  Contact.   Quinn Plowman RN,BSN,CCM [facility] Telephonic  [phone]

## 2016-06-14 DIAGNOSIS — D649 Anemia, unspecified: Secondary | ICD-10-CM | POA: Diagnosis not present

## 2016-06-14 DIAGNOSIS — I110 Hypertensive heart disease with heart failure: Secondary | ICD-10-CM | POA: Diagnosis not present

## 2016-06-14 DIAGNOSIS — N390 Urinary tract infection, site not specified: Secondary | ICD-10-CM | POA: Diagnosis not present

## 2016-06-14 DIAGNOSIS — I5030 Unspecified diastolic (congestive) heart failure: Secondary | ICD-10-CM | POA: Diagnosis not present

## 2016-06-14 DIAGNOSIS — N1330 Unspecified hydronephrosis: Secondary | ICD-10-CM | POA: Diagnosis not present

## 2016-06-14 DIAGNOSIS — I2510 Atherosclerotic heart disease of native coronary artery without angina pectoris: Secondary | ICD-10-CM | POA: Diagnosis not present

## 2016-06-14 DIAGNOSIS — Z48816 Encounter for surgical aftercare following surgery on the genitourinary system: Secondary | ICD-10-CM | POA: Diagnosis not present

## 2016-06-14 DIAGNOSIS — A4181 Sepsis due to Enterococcus: Secondary | ICD-10-CM | POA: Diagnosis not present

## 2016-06-14 DIAGNOSIS — G40909 Epilepsy, unspecified, not intractable, without status epilepticus: Secondary | ICD-10-CM | POA: Diagnosis not present

## 2016-06-19 DIAGNOSIS — N3289 Other specified disorders of bladder: Secondary | ICD-10-CM | POA: Diagnosis not present

## 2016-06-19 DIAGNOSIS — D494 Neoplasm of unspecified behavior of bladder: Secondary | ICD-10-CM | POA: Diagnosis not present

## 2016-06-19 DIAGNOSIS — C679 Malignant neoplasm of bladder, unspecified: Secondary | ICD-10-CM | POA: Diagnosis not present

## 2016-06-20 ENCOUNTER — Other Ambulatory Visit: Payer: Self-pay | Admitting: Sports Medicine

## 2016-06-20 DIAGNOSIS — I10 Essential (primary) hypertension: Secondary | ICD-10-CM

## 2016-06-21 ENCOUNTER — Encounter: Payer: Self-pay | Admitting: Sports Medicine

## 2016-06-21 ENCOUNTER — Ambulatory Visit (INDEPENDENT_AMBULATORY_CARE_PROVIDER_SITE_OTHER): Payer: Commercial Managed Care - HMO | Admitting: Sports Medicine

## 2016-06-21 DIAGNOSIS — C679 Malignant neoplasm of bladder, unspecified: Secondary | ICD-10-CM | POA: Diagnosis not present

## 2016-06-21 DIAGNOSIS — R8299 Other abnormal findings in urine: Secondary | ICD-10-CM | POA: Diagnosis not present

## 2016-06-21 DIAGNOSIS — M1711 Unilateral primary osteoarthritis, right knee: Secondary | ICD-10-CM

## 2016-06-21 LAB — POCT URINALYSIS DIPSTICK
Bilirubin, UA: NEGATIVE
Glucose, UA: NEGATIVE
Ketones, UA: NEGATIVE
Nitrite, UA: NEGATIVE
Spec Grav, UA: <=1.005 — AB (ref 1.010–1.025)
Urobilinogen, UA: 0.2 U/dL
pH, UA: 6 (ref 5.0–8.0)

## 2016-06-21 LAB — CBC
HCT: 32.1 % — ABNORMAL LOW (ref 38.5–50.0)
Hemoglobin: 10.1 g/dL — ABNORMAL LOW (ref 13.2–17.1)
MCH: 28.2 pg (ref 27.0–33.0)
MCHC: 31.5 g/dL — ABNORMAL LOW (ref 32.0–36.0)
MCV: 89.7 fL (ref 80.0–100.0)
MPV: 10.7 fL (ref 7.5–12.5)
Platelets: 225 10*3/uL (ref 140–400)
RBC: 3.58 MIL/uL — ABNORMAL LOW (ref 4.20–5.80)
RDW: 14.8 % (ref 11.0–15.0)
WBC: 6.6 10*3/uL (ref 3.8–10.8)

## 2016-06-21 MED ORDER — AMPICILLIN 500 MG PO CAPS: 500.0000 mg | ORAL_CAPSULE | Freq: Four times a day (QID) | ORAL | 0 refills | Status: DC

## 2016-06-21 MED ORDER — HYDROCODONE-ACETAMINOPHEN 7.5-325 MG PO TABS: 1.0000 | ORAL_TABLET | Freq: Three times a day (TID) | ORAL | 0 refills | Status: DC | PRN

## 2016-06-21 MED ORDER — CEFTRIAXONE SODIUM 1 G IJ SOLR
1.0000 g | Freq: Once | INTRAMUSCULAR | Status: AC
  Administered 2016-06-21: 1 g via INTRAMUSCULAR

## 2016-06-21 NOTE — Addendum Note (Signed)
Addended by: Elizabeth Sauer on: 06/21/2016 03:08 PM   Modules accepted: Orders

## 2016-06-21 NOTE — Progress Notes (Signed)
  Subjective:    CC: Hospital follow-up  HPI: Jeffery Hart is exquisitely pleasant 83 year old male, he has a history of bladder cancer and recently had ureteral stents placed, unfortunately he developed fevers, chills, rigors, pain. He was seen in the emergency department, ultimately admitted to the hospital with bacteremia and treated with IV ampicillin and Rocephin, for non-VRE enterococcus.  He improved, was never in the ICU. Ultimately he had stents removed and replaced. Unfortunately for the past several days he has had increasing pain in his pelvis with radiation to the flanks. No constitutional symptoms yet.  Past medical history:  Negative.  See flowsheet/record as well for more information.  Surgical history: Negative.  See flowsheet/record as well for more information.  Family history: Negative.  See flowsheet/record as well for more information.  Social history: Negative.  See flowsheet/record as well for more information.  Allergies, and medications have been entered into the medical record, reviewed, and no changes needed.   Review of Systems: No fevers, chills, night sweats, weight loss, chest pain, or shortness of breath.   Objective:    General: Well Developed, well nourished, and in no acute distress.  Neuro: Alert and oriented x3, extra-ocular muscles intact, sensation grossly intact.  HEENT: Normocephalic, atraumatic, pupils equal round reactive to light, neck supple, no masses, no lymphadenopathy, thyroid nonpalpable.  Skin: Warm and dry, no rashes. Cardiac: Regular rate and rhythm, no murmurs rubs or gallops, no lower extremity edema.  Respiratory: Clear to auscultation bilaterally. Not using accessory muscles, speaking in full sentences. Abdomen: Soft, tender to palpation in the suprapubic region, there is positive bilateral right worse than left costovertebral angle pain, mild guarding, no rigidity, rebound tenderness. Normal bowel sounds.  Urinalysis is positive for  leukocytes and blood.  Impression and Recommendations:    Bladder cancer ([place]) Had some ureteral stents placed, developed enterococcal bacteremia, needed ampicillin and Rocephin. Since then he has been discharged from the hospital, new stents were placed, unfortunately he is starting to develop abdominal pain, malaise. Hasn't developed any fevers. We are going start aggressive, Rocephin 1 g intramuscular, urinalysis, urine culture, CBC, CMP, blood cultures. Ampicillin.  Return to see me middle of next week.  I spent 25 minutes with this patient, greater than 50% was face-to-face time counseling regarding the above diagnoses

## 2016-06-21 NOTE — Assessment & Plan Note (Signed)
Had some ureteral stents placed, developed enterococcal bacteremia, needed ampicillin and Rocephin. Since then he has been discharged from the hospital, new stents were placed, unfortunately he is starting to develop abdominal pain, malaise. Hasn't developed any fevers. We are going start aggressive, Rocephin 1 g intramuscular, urinalysis, urine culture, CBC, CMP, blood cultures. Ampicillin.  Return to see me middle of next week.

## 2016-06-22 LAB — COMPREHENSIVE METABOLIC PANEL
ALT: 7 U/L — ABNORMAL LOW (ref 9–46)
Alkaline Phosphatase: 72 U/L (ref 40–115)
BUN: 22 mg/dL (ref 7–25)
CO2: 23 mmol/L (ref 20–31)
Potassium: 4.6 mmol/L (ref 3.5–5.3)
Total Protein: 6.4 g/dL (ref 6.1–8.1)

## 2016-06-22 LAB — COMPREHENSIVE METABOLIC PANEL WITH GFR
AST: 13 U/L (ref 10–35)
Albumin: 3.9 g/dL (ref 3.6–5.1)
Calcium: 8.8 mg/dL (ref 8.6–10.3)
Chloride: 105 mmol/L (ref 98–110)
Creat: 1.37 mg/dL — ABNORMAL HIGH (ref 0.70–1.11)
Glucose, Bld: 94 mg/dL (ref 65–99)
Sodium: 140 mmol/L (ref 135–146)
Total Bilirubin: 0.3 mg/dL (ref 0.2–1.2)

## 2016-06-23 LAB — URINE CULTURE

## 2016-06-25 ENCOUNTER — Other Ambulatory Visit: Payer: Self-pay

## 2016-06-25 NOTE — Patient Outreach (Signed)
[name]) Care Management  06/25/2016  Jeffery Hart 1927/06/17 [id number]  Transition of care  Referral date: 05/28/16 Referral source: Status post hospital discharge on 05/25/16 from [place] [facility] Program: OTHER: Bacterial infection status post stent placement for bladder neoplasm Insurance: [employer] Providers: Dr. Aundria Mems, primary MD Dr. Olean Ree - Gastroenterology Dr. Tommi Rumps- surgeon Social support: Lives with spouse and disabled son. Has Lucianne Lei transportation assistance.  VERBAL CONSENT; Patient gave verbal authorization to speak with his wife, Jeffery Hart regarding all of her personal health information.    Telephone call to patient. Unable to reach. HIPAA compliant voice message left with call back phone number.   PLAN; Outreach letter has been sent to patient.   Quinn Plowman RN,BSN,CCM [facility] Telephonic  [phone]

## 2016-06-25 NOTE — Telephone Encounter (Signed)
This encounter was created in error - please disregard.

## 2016-06-27 ENCOUNTER — Encounter: Payer: Self-pay | Admitting: Sports Medicine

## 2016-06-27 ENCOUNTER — Ambulatory Visit (INDEPENDENT_AMBULATORY_CARE_PROVIDER_SITE_OTHER): Payer: Commercial Managed Care - HMO | Admitting: Sports Medicine

## 2016-06-27 ENCOUNTER — Other Ambulatory Visit: Payer: Self-pay

## 2016-06-27 DIAGNOSIS — I10 Essential (primary) hypertension: Secondary | ICD-10-CM

## 2016-06-27 DIAGNOSIS — C679 Malignant neoplasm of bladder, unspecified: Secondary | ICD-10-CM

## 2016-06-27 MED ORDER — AMPICILLIN 500 MG PO CAPS: 500.0000 mg | ORAL_CAPSULE | Freq: Four times a day (QID) | ORAL | 0 refills | Status: DC

## 2016-06-27 NOTE — Patient Outreach (Signed)
[facility]) Care Management  06/27/2016  [name] 10-31-1927 [id number]  Case Closure: No response from patient after 3 telephone calls and letter outreach.    PLAN; RNCM will refer patient to care management assistant to close due to patient withdrawing from program.  RNCM will notify patients primary MD of closure RNCM will send patient closure letter.   Quinn Plowman RN,BSN,CCM [facility] Telephonic  [phone]

## 2016-06-27 NOTE — Assessment & Plan Note (Addendum)
No changes, blood pressure is normal at home, he develops white coat elevations.

## 2016-06-27 NOTE — Assessment & Plan Note (Signed)
Doing well, symptoms have now resolved after Rocephin and ampicillin. He did previously have non-VRE enterococcal bacteremia after ureteral stents were placed, they were replaced. I'm going to print him out some ampicillin just to take if he develops recurrence of symptoms since he is stopping his antibiotics today. Return as needed.

## 2016-06-27 NOTE — Progress Notes (Signed)
  Subjective:    CC: Follow-up  HPI: Jeffery Hart returns, he's doing extremely well, he had a urinary tract infection at the last visit that we treated aggressively with Rocephin and ampicillin, this was due to the fact that he recently had urinary tract instrumentation with stenting, and ended up in the hospital with an enterococcal urinary tract infection and enterococcal bacteremia. His symptoms have essentially totally resolved, he's happy with how things have gone.  Hypertension: No headaches, visual changes, chest pain, when his wife is a nurse checks it at home it is completely normal.  Past medical history:  Negative.  See flowsheet/record as well for more information.  Surgical history: Negative.  See flowsheet/record as well for more information.  Family history: Negative.  See flowsheet/record as well for more information.  Social history: Negative.  See flowsheet/record as well for more information.  Allergies, and medications have been entered into the medical record, reviewed, and no changes needed.   Review of Systems: No fevers, chills, night sweats, weight loss, chest pain, or shortness of breath.   Objective:    General: Well Developed, well nourished, and in no acute distress.  Neuro: Alert and oriented x3, extra-ocular muscles intact, sensation grossly intact.  HEENT: Normocephalic, atraumatic, pupils equal round reactive to light, neck supple, no masses, no lymphadenopathy, thyroid nonpalpable.  Skin: Warm and dry, no rashes. Cardiac: Regular rate and rhythm, no murmurs rubs or gallops, no lower extremity edema.  Respiratory: Clear to auscultation bilaterally. Not using accessory muscles, speaking in full sentences. Abdomen: Soft, nontender, nondistended, no bowel sounds, no palpable masses, no guarding, rigidity, rebound pain, no costovertebral angle pain.  Impression and Recommendations:    Bladder cancer [facility]) Doing well, symptoms have now resolved after Rocephin and  ampicillin. He did previously have non-VRE enterococcal bacteremia after ureteral stents were placed, they were replaced. I'm going to print him out some ampicillin just to take if he develops recurrence of symptoms since he is stopping his antibiotics today. Return as needed.  Essential hypertension, benign No changes, blood pressure is normal at home, he develops white coat elevations.  I spent 25 minutes with this patient, greater than 50% was face-to-face time counseling regarding the above diagnoses

## 2016-06-29 LAB — CULTURE, BLOOD (SINGLE): Organism ID, Bacteria: NO GROWTH

## 2016-07-15 ENCOUNTER — Encounter: Payer: Self-pay | Admitting: Sports Medicine

## 2016-07-15 ENCOUNTER — Ambulatory Visit (INDEPENDENT_AMBULATORY_CARE_PROVIDER_SITE_OTHER): Payer: Commercial Managed Care - HMO | Admitting: Sports Medicine

## 2016-07-15 DIAGNOSIS — R1024 Suprapubic pain: Secondary | ICD-10-CM | POA: Insufficient documentation

## 2016-07-15 DIAGNOSIS — R102 Pelvic and perineal pain: Secondary | ICD-10-CM | POA: Diagnosis not present

## 2016-07-15 LAB — CBC WITH DIFFERENTIAL/PLATELET
Basophils Absolute: 70 cells/uL (ref 0–200)
Basophils Relative: 1 %
Eosinophils Absolute: 490 cells/uL (ref 15–500)
Eosinophils Relative: 7 %
HCT: 31.6 % — ABNORMAL LOW (ref 38.5–50.0)
Hemoglobin: 10.4 g/dL — ABNORMAL LOW (ref 13.2–17.1)
Lymphocytes Relative: 15 %
Lymphs Abs: 1050 cells/uL (ref 850–3900)
MCH: 29.4 pg (ref 27.0–33.0)
MCHC: 32.9 g/dL (ref 32.0–36.0)
MCV: 89.3 fL (ref 80.0–100.0)
MPV: 9.8 fL (ref 7.5–12.5)
Monocytes Absolute: 700 cells/uL (ref 200–950)
Monocytes Relative: 10 %
Neutro Abs: 4690 cells/uL (ref 1500–7800)
Neutrophils Relative %: 67 %
Platelets: 195 K/uL (ref 140–400)
RBC: 3.54 MIL/uL — ABNORMAL LOW (ref 4.20–5.80)
RDW: 14.3 % (ref 11.0–15.0)
WBC: 7 K/uL (ref 3.8–10.8)

## 2016-07-15 LAB — POCT URINALYSIS DIPSTICK
Bilirubin, UA: NEGATIVE
Glucose, UA: NEGATIVE
Ketones, UA: NEGATIVE
Nitrite, UA: NEGATIVE
Protein, UA: NEGATIVE
Spec Grav, UA: <=1.005 — AB (ref 1.010–1.025)
Urobilinogen, UA: 0.2 U/dL
pH, UA: 6 (ref 5.0–8.0)

## 2016-07-15 MED ORDER — CIPROFLOXACIN HCL 750 MG PO TABS: 750.0000 mg | ORAL_TABLET | Freq: Two times a day (BID) | ORAL | 0 refills | Status: AC

## 2016-07-15 NOTE — Addendum Note (Signed)
Addended by: Silverio Decamp on: 07/15/2016 05:35 PM   Modules accepted: Orders

## 2016-07-15 NOTE — Addendum Note (Signed)
Addended by: Elizabeth Sauer on: 07/15/2016 04:36 PM   Modules accepted: Orders

## 2016-07-15 NOTE — Assessment & Plan Note (Addendum)
Recurrence of suprapubic abdominal pain, feels similar to his prior UTI. He did have enterococcal bacteremia.  We treated him at the last visit, the urine culture seemed contaminated. His wife also seems to think his stools are mucous covered, he is having 8-10 per day and has been on multiple recent courses of antibiotics. Urine culture, CBC, CMP, lipase, amylase, stool cultures and C. difficile toxin screen. 2 view abdominal x-rays. Keep close follow-up.  Urinalysis is positive for blood and a large number of leukocytes, adding ciprofloxacin this time for 14 days and awaiting culture results.

## 2016-07-15 NOTE — Progress Notes (Signed)
  Subjective:    CC: Abdominal pain  HPI: This is a pleasant 83 year old male with a history of bladder cancer, he's had ureteral stenting with unfortunate iatrogenic urinary tract infection that ended up with enterococcal bacteremia. We treated him for a urinary tract infection at the last visit quite aggressively and he improved. Unfortunately he started to have a recurrence of pain, suprapubic, similar to before. No constitutional symptoms yet. He is however having some diarrhea, 8-10 stools per day that are covered with mucous and smell like C. difficile per his wife who is a Marine scientist.  Past medical history:  Negative.  See flowsheet/record as well for more information.  Surgical history: Negative.  See flowsheet/record as well for more information.  Family history: Negative.  See flowsheet/record as well for more information.  Social history: Negative.  See flowsheet/record as well for more information.  Allergies, and medications have been entered into the medical record, reviewed, and no changes needed.   Review of Systems: No fevers, chills, night sweats, weight loss, chest pain, or shortness of breath.   Objective:    General: Well Developed, well nourished, and in no acute distress.  Neuro: Alert and oriented x3, extra-ocular muscles intact, sensation grossly intact.  HEENT: Normocephalic, atraumatic, pupils equal round reactive to light, neck supple, no masses, no lymphadenopathy, thyroid nonpalpable.  Skin: Warm and dry, no rashes. Cardiac: Regular rate and rhythm, no murmurs rubs or gallops, no lower extremity edema.  Respiratory: Clear to auscultation bilaterally. Not using accessory muscles, speaking in full sentences. Abdomen: Soft, minimally tender in suprapubic region, nondistended, no guarding, no rigidity, no rebound pain, mild left greater than right costovertebral angle pain.  Impression and Recommendations:    Abdominal pain, suprapubic Recurrence of suprapubic  abdominal pain, feels similar to his prior UTI. He did have enterococcal bacteremia.  We treated him at the last visit, the urine culture seemed contaminated. His wife also seems to think his stools are mucous covered, he is having 8-10 per day and has been on multiple recent courses of antibiotics. Urine culture, CBC, CMP, lipase, amylase, stool cultures and C. difficile toxin screen. 2 view abdominal x-rays. Keep close follow-up.  I spent 25 minutes with this patient, greater than 50% was face-to-face time counseling regarding the above diagnoses

## 2016-07-16 ENCOUNTER — Other Ambulatory Visit: Payer: Self-pay | Admitting: Sports Medicine

## 2016-07-16 ENCOUNTER — Ambulatory Visit (INDEPENDENT_AMBULATORY_CARE_PROVIDER_SITE_OTHER): Payer: Commercial Managed Care - HMO

## 2016-07-16 DIAGNOSIS — Z8551 Personal history of malignant neoplasm of bladder: Secondary | ICD-10-CM | POA: Diagnosis not present

## 2016-07-16 DIAGNOSIS — R102 Pelvic and perineal pain: Secondary | ICD-10-CM

## 2016-07-16 DIAGNOSIS — K8020 Calculus of gallbladder without cholecystitis without obstruction: Secondary | ICD-10-CM | POA: Diagnosis not present

## 2016-07-16 DIAGNOSIS — Z960 Presence of urogenital implants: Secondary | ICD-10-CM | POA: Diagnosis not present

## 2016-07-16 LAB — COMPREHENSIVE METABOLIC PANEL
Albumin: 3.8 g/dL (ref 3.6–5.1)
CO2: 24 mmol/L (ref 20–31)
Chloride: 103 mmol/L (ref 98–110)
Glucose, Bld: 102 mg/dL — ABNORMAL HIGH (ref 65–99)
Potassium: 4.6 mmol/L (ref 3.5–5.3)
Total Protein: 5.9 g/dL — ABNORMAL LOW (ref 6.1–8.1)

## 2016-07-16 LAB — COMPREHENSIVE METABOLIC PANEL WITH GFR
ALT: 10 U/L (ref 9–46)
AST: 13 U/L (ref 10–35)
Alkaline Phosphatase: 78 U/L (ref 40–115)
BUN: 21 mg/dL (ref 7–25)
Calcium: 8.4 mg/dL — ABNORMAL LOW (ref 8.6–10.3)
Creat: 1.21 mg/dL — ABNORMAL HIGH (ref 0.70–1.11)
Sodium: 134 mmol/L — ABNORMAL LOW (ref 135–146)
Total Bilirubin: 0.3 mg/dL (ref 0.2–1.2)

## 2016-07-16 LAB — AMYLASE: Amylase: 59 U/L (ref 21–101)

## 2016-07-16 LAB — LIPASE: Lipase: 24 U/L (ref 7–60)

## 2016-07-16 NOTE — Progress Notes (Signed)
Original order cancelled by lab. Pt will drop off sample this week.

## 2016-07-17 ENCOUNTER — Telehealth: Payer: Self-pay | Admitting: Sports Medicine

## 2016-07-17 LAB — C. DIFFICILE GDH AND TOXIN A/B
C. difficile GDH: DETECTED — AB
C. difficile Toxin A/B: DETECTED — AB

## 2016-07-17 MED ORDER — METRONIDAZOLE 500 MG PO TABS: 500.0000 mg | ORAL_TABLET | Freq: Three times a day (TID) | ORAL | 0 refills | Status: DC

## 2016-07-17 NOTE — Telephone Encounter (Signed)
Received on call paged yesterday, stool  toxin testing is positive for C. difficile, adding oral metronidazole. Awaiting these results to populate into the results section of EPIC. Please inform patient and wife.

## 2016-07-18 ENCOUNTER — Encounter: Payer: Self-pay | Admitting: Sports Medicine

## 2016-07-18 ENCOUNTER — Ambulatory Visit (INDEPENDENT_AMBULATORY_CARE_PROVIDER_SITE_OTHER): Payer: Commercial Managed Care - HMO | Admitting: Sports Medicine

## 2016-07-18 DIAGNOSIS — A0472 Enterocolitis due to Clostridium difficile, not specified as recurrent: Secondary | ICD-10-CM | POA: Diagnosis not present

## 2016-07-18 DIAGNOSIS — Z8619 Personal history of other infectious and parasitic diseases: Secondary | ICD-10-CM | POA: Insufficient documentation

## 2016-07-18 HISTORY — DX: Enterocolitis due to Clostridium difficile, not specified as recurrent: A04.72

## 2016-07-18 HISTORY — DX: Personal history of other infectious and parasitic diseases: Z86.19

## 2016-07-18 NOTE — Progress Notes (Signed)
  Subjective:    CC: Follow-up  HPI: This is a pleasant 83 year old male, we have been treating him for a recurrent urinary tract infection, he does have urinary tract instrumentation with J stents. Unfortunately he developed some diarrhea, his test came back positive for C. difficile, but he has not yet picked up his metronidazole, feels about the same currently.  Past medical history:  Negative.  See flowsheet/record as well for more information.  Surgical history: Negative.  See flowsheet/record as well for more information.  Family history: Negative.  See flowsheet/record as well for more information.  Social history: Negative.  See flowsheet/record as well for more information.  Allergies, and medications have been entered into the medical record, reviewed, and no changes needed.   Review of Systems: No fevers, chills, night sweats, weight loss, chest pain, or shortness of breath.   Objective:    General: Well Developed, well nourished, and in no acute distress.  Neuro: Alert and oriented x3, extra-ocular muscles intact, sensation grossly intact.  HEENT: Normocephalic, atraumatic, pupils equal round reactive to light, neck supple, no masses, no lymphadenopathy, thyroid nonpalpable.  Skin: Warm and dry, no rashes. Cardiac: Regular rate and rhythm, no murmurs rubs or gallops, no lower extremity edema.  Respiratory: Clear to auscultation bilaterally. Not using accessory muscles, speaking in full sentences. Abdomen: Soft, diffusely tender to palpation, nondistended, no bowel sounds, no palpable masses, no guarding, rigidity.  Impression and Recommendations:    Clostridium difficile diarrhea Toxin test came back positive, for some reason it still has not populated into the EMR. We did get a phone call from the lab. Burnham has not yet picked up his Flagyl, he will do this, take the entire course and call me if still symptomatic at which point we will switch to oral vancomycin.

## 2016-07-18 NOTE — Telephone Encounter (Signed)
Wife of pt notified.

## 2016-07-18 NOTE — Assessment & Plan Note (Signed)
Toxin test came back positive, for some reason it still has not populated into the EMR. We did get a phone call from the lab. Jeffery Hart has not yet picked up his Flagyl, he will do this, take the entire course and call me if still symptomatic at which point we will switch to oral vancomycin.

## 2016-07-20 LAB — URINE CULTURE

## 2016-07-20 LAB — STOOL CULTURE

## 2016-07-24 DIAGNOSIS — L821 Other seborrheic keratosis: Secondary | ICD-10-CM | POA: Diagnosis not present

## 2016-08-01 ENCOUNTER — Other Ambulatory Visit: Payer: Self-pay | Admitting: Sports Medicine

## 2016-08-07 DIAGNOSIS — R3915 Urgency of urination: Secondary | ICD-10-CM | POA: Diagnosis not present

## 2016-08-07 DIAGNOSIS — C679 Malignant neoplasm of bladder, unspecified: Secondary | ICD-10-CM | POA: Diagnosis not present

## 2016-08-07 DIAGNOSIS — R350 Frequency of micturition: Secondary | ICD-10-CM | POA: Diagnosis not present

## 2016-08-19 DIAGNOSIS — C679 Malignant neoplasm of bladder, unspecified: Secondary | ICD-10-CM | POA: Diagnosis not present

## 2016-08-19 DIAGNOSIS — E785 Hyperlipidemia, unspecified: Secondary | ICD-10-CM | POA: Diagnosis not present

## 2016-08-19 DIAGNOSIS — N2889 Other specified disorders of kidney and ureter: Secondary | ICD-10-CM | POA: Diagnosis not present

## 2016-08-19 DIAGNOSIS — I129 Hypertensive chronic kidney disease with stage 1 through stage 4 chronic kidney disease, or unspecified chronic kidney disease: Secondary | ICD-10-CM | POA: Diagnosis not present

## 2016-08-19 DIAGNOSIS — I2510 Atherosclerotic heart disease of native coronary artery without angina pectoris: Secondary | ICD-10-CM | POA: Diagnosis not present

## 2016-08-19 DIAGNOSIS — N1330 Unspecified hydronephrosis: Secondary | ICD-10-CM | POA: Diagnosis not present

## 2016-08-19 DIAGNOSIS — Z951 Presence of aortocoronary bypass graft: Secondary | ICD-10-CM | POA: Diagnosis not present

## 2016-08-19 DIAGNOSIS — G4733 Obstructive sleep apnea (adult) (pediatric): Secondary | ICD-10-CM | POA: Diagnosis not present

## 2016-08-19 DIAGNOSIS — Z960 Presence of urogenital implants: Secondary | ICD-10-CM | POA: Diagnosis not present

## 2016-08-19 DIAGNOSIS — N189 Chronic kidney disease, unspecified: Secondary | ICD-10-CM | POA: Diagnosis not present

## 2016-08-19 DIAGNOSIS — I252 Old myocardial infarction: Secondary | ICD-10-CM | POA: Diagnosis not present

## 2016-08-19 DIAGNOSIS — G4730 Sleep apnea, unspecified: Secondary | ICD-10-CM | POA: Diagnosis not present

## 2016-08-19 DIAGNOSIS — N135 Crossing vessel and stricture of ureter without hydronephrosis: Secondary | ICD-10-CM | POA: Diagnosis not present

## 2016-08-19 DIAGNOSIS — D494 Neoplasm of unspecified behavior of bladder: Secondary | ICD-10-CM | POA: Diagnosis not present

## 2016-09-02 ENCOUNTER — Other Ambulatory Visit: Payer: Self-pay | Admitting: Sports Medicine

## 2016-09-10 ENCOUNTER — Other Ambulatory Visit: Payer: Self-pay | Admitting: Sports Medicine

## 2016-09-16 ENCOUNTER — Other Ambulatory Visit: Payer: Self-pay | Admitting: Sports Medicine

## 2016-09-16 DIAGNOSIS — I10 Essential (primary) hypertension: Secondary | ICD-10-CM

## 2016-09-25 DIAGNOSIS — C679 Malignant neoplasm of bladder, unspecified: Secondary | ICD-10-CM | POA: Diagnosis not present

## 2016-10-09 ENCOUNTER — Ambulatory Visit (INDEPENDENT_AMBULATORY_CARE_PROVIDER_SITE_OTHER): Payer: Medicare HMO | Admitting: Family Medicine

## 2016-10-09 VITALS — Temp 98.2°F

## 2016-10-09 DIAGNOSIS — Z23 Encounter for immunization: Secondary | ICD-10-CM | POA: Diagnosis not present

## 2016-10-09 NOTE — Progress Notes (Signed)
Pt here for flu vaccine.  Given in left deltoid.  Tolerated procedure well and without complications.

## 2016-10-14 ENCOUNTER — Other Ambulatory Visit: Payer: Self-pay | Admitting: Sports Medicine

## 2016-10-29 DIAGNOSIS — R319 Hematuria, unspecified: Secondary | ICD-10-CM | POA: Diagnosis not present

## 2016-11-20 DIAGNOSIS — C679 Malignant neoplasm of bladder, unspecified: Secondary | ICD-10-CM | POA: Diagnosis not present

## 2016-11-25 DIAGNOSIS — Z8546 Personal history of malignant neoplasm of prostate: Secondary | ICD-10-CM | POA: Diagnosis not present

## 2016-11-25 DIAGNOSIS — I10 Essential (primary) hypertension: Secondary | ICD-10-CM | POA: Diagnosis not present

## 2016-11-25 DIAGNOSIS — N135 Crossing vessel and stricture of ureter without hydronephrosis: Secondary | ICD-10-CM | POA: Diagnosis not present

## 2016-11-25 DIAGNOSIS — Z888 Allergy status to other drugs, medicaments and biological substances status: Secondary | ICD-10-CM | POA: Diagnosis not present

## 2016-11-25 DIAGNOSIS — I2510 Atherosclerotic heart disease of native coronary artery without angina pectoris: Secondary | ICD-10-CM | POA: Diagnosis not present

## 2016-11-25 DIAGNOSIS — C679 Malignant neoplasm of bladder, unspecified: Secondary | ICD-10-CM | POA: Diagnosis not present

## 2016-11-25 DIAGNOSIS — R569 Unspecified convulsions: Secondary | ICD-10-CM | POA: Diagnosis not present

## 2016-11-25 DIAGNOSIS — Z466 Encounter for fitting and adjustment of urinary device: Secondary | ICD-10-CM | POA: Diagnosis not present

## 2016-11-25 DIAGNOSIS — Z955 Presence of coronary angioplasty implant and graft: Secondary | ICD-10-CM | POA: Diagnosis not present

## 2016-11-25 DIAGNOSIS — I252 Old myocardial infarction: Secondary | ICD-10-CM | POA: Diagnosis not present

## 2016-11-25 DIAGNOSIS — N1330 Unspecified hydronephrosis: Secondary | ICD-10-CM | POA: Diagnosis not present

## 2016-11-25 DIAGNOSIS — G4733 Obstructive sleep apnea (adult) (pediatric): Secondary | ICD-10-CM | POA: Diagnosis not present

## 2016-12-07 ENCOUNTER — Other Ambulatory Visit: Payer: Self-pay | Admitting: Sports Medicine

## 2016-12-16 ENCOUNTER — Other Ambulatory Visit: Payer: Self-pay | Admitting: Sports Medicine

## 2016-12-16 DIAGNOSIS — I10 Essential (primary) hypertension: Secondary | ICD-10-CM

## 2017-01-29 DIAGNOSIS — L821 Other seborrheic keratosis: Secondary | ICD-10-CM | POA: Diagnosis not present

## 2017-01-29 DIAGNOSIS — Z08 Encounter for follow-up examination after completed treatment for malignant neoplasm: Secondary | ICD-10-CM | POA: Diagnosis not present

## 2017-01-29 DIAGNOSIS — Z85828 Personal history of other malignant neoplasm of skin: Secondary | ICD-10-CM | POA: Diagnosis not present

## 2017-02-01 ENCOUNTER — Other Ambulatory Visit: Payer: Self-pay | Admitting: Sports Medicine

## 2017-02-14 ENCOUNTER — Other Ambulatory Visit: Payer: Self-pay | Admitting: Sports Medicine

## 2017-02-17 ENCOUNTER — Ambulatory Visit (INDEPENDENT_AMBULATORY_CARE_PROVIDER_SITE_OTHER): Payer: Medicare HMO | Admitting: Sports Medicine

## 2017-02-17 DIAGNOSIS — M1711 Unilateral primary osteoarthritis, right knee: Secondary | ICD-10-CM | POA: Diagnosis not present

## 2017-02-17 NOTE — Assessment & Plan Note (Signed)
Aspiration and injection, return as needed.

## 2017-02-17 NOTE — Progress Notes (Signed)
Subjective:    CC: Right knee pain  HPI: Pleasant 84 year old male, it has been a long time since we have injected his right knee, starting to have recurrence of pain and swelling, moderate, persistent, localized without radiation.  I reviewed the past medical history, family history, social history, surgical history, and allergies today and no changes were needed.  Please see the problem list section below in epic for further details.  Past Medical History: No past medical history on file. Past Surgical History: Past Surgical History:  Procedure Laterality Date  . APPENDECTOMY  05/03/2012  . CATARACT EXTRACTION Left 06/13/2011   eye  . CATARACT EXTRACTION Right 08/15/2011   eye  . HERNIA REPAIR  2009  . LUMBAR LAMINECTOMY/ DECOMPRESSION WITH MET-RX  2008  . mandibular tori  [phone]  . MAXIMUM ACCESS (MAS)POSTERIOR LUMBAR INTERBODY FUSION (PLIF) 1 LEVEL  01/12/2010  . [place]  . PROSTATECTOMY  1992  . RADICAL ORCHIECTOMY  2004  . removal of nasal polyps and sinus tissue  1997  . SHOULDER ARTHROSCOPY WITH SUBACROMIAL DECOMPRESSION, ROTATOR CUFF REPAIR AND BICEP TENDON REPAIR  1998  . thumb joint repair Left 1999  . thumb joint repair Right 2004  . TONSILECTOMY, ADENOIDECTOMY, BILATERAL MYRINGOTOMY AND TUBES  1940  . triple by-pass heart surgery  2006  . UVULOPALATOPHARYNGOPLASTY  1996   Social History: Social History   Socioeconomic History  . Marital status: Married    Spouse name: Not on file  . Number of children: Not on file  . Years of education: Not on file  . Highest education level: Not on file  Social Needs  . Financial resource strain: Not on file  . Food insecurity - worry: Not on file  . Food insecurity - inability: Not on file  . Transportation needs - medical: Not on file  . Transportation needs - non-medical: Not on file  Occupational History  . Not on file  Tobacco Use  . Smoking status: Former Research scientist (life sciences)  . Smokeless  tobacco: Never Used  Substance and Sexual Activity  . Alcohol use: No  . Drug use: No  . Sexual activity: Not on file  Other Topics Concern  . Not on file  Social History Narrative  . Not on file   Family History: No family history on file. Allergies: Allergies  Allergen Reactions  . Aspirin Other (See Comments)    Unknown/Pt states unable to take due to stomach issues and anemia  . Metolazone Other (See Comments)    / Pt denies   Medications: See med rec.  Review of Systems: No fevers, chills, night sweats, weight loss, chest pain, or shortness of breath.   Objective:    General: Well Developed, well nourished, and in no acute distress.  Neuro: Alert and oriented x3, extra-ocular muscles intact, sensation grossly intact.  HEENT: Normocephalic, atraumatic, pupils equal round reactive to light, neck supple, no masses, no lymphadenopathy, thyroid nonpalpable.  Skin: Warm and dry, no rashes. Cardiac: Regular rate and rhythm, no murmurs rubs or gallops, no lower extremity edema.  Respiratory: Clear to auscultation bilaterally. Not using accessory muscles, speaking in full sentences. Right knee: Visibly swollen with a palpable fluid wave, tender to palpation at the medial joint line and the patellar facets. ROM normal in flexion and extension and lower leg rotation. Ligaments with solid consistent endpoints including ACL, PCL, LCL, MCL. Negative Mcmurray's and provocative meniscal tests. Non painful patellar compression. Patellar and quadriceps tendons unremarkable. Hamstring and quadriceps

## 2017-03-10 ENCOUNTER — Other Ambulatory Visit: Payer: Self-pay | Admitting: Sports Medicine

## 2017-03-14 ENCOUNTER — Other Ambulatory Visit: Payer: Self-pay | Admitting: Sports Medicine

## 2017-03-14 DIAGNOSIS — I10 Essential (primary) hypertension: Secondary | ICD-10-CM

## 2017-03-17 DIAGNOSIS — G4730 Sleep apnea, unspecified: Secondary | ICD-10-CM | POA: Diagnosis not present

## 2017-03-17 DIAGNOSIS — C679 Malignant neoplasm of bladder, unspecified: Secondary | ICD-10-CM | POA: Diagnosis not present

## 2017-03-17 DIAGNOSIS — E785 Hyperlipidemia, unspecified: Secondary | ICD-10-CM | POA: Diagnosis not present

## 2017-03-17 DIAGNOSIS — Z951 Presence of aortocoronary bypass graft: Secondary | ICD-10-CM | POA: Diagnosis not present

## 2017-03-17 DIAGNOSIS — Z466 Encounter for fitting and adjustment of urinary device: Secondary | ICD-10-CM | POA: Diagnosis not present

## 2017-03-17 DIAGNOSIS — N1330 Unspecified hydronephrosis: Secondary | ICD-10-CM | POA: Diagnosis not present

## 2017-03-17 DIAGNOSIS — I252 Old myocardial infarction: Secondary | ICD-10-CM | POA: Diagnosis not present

## 2017-03-17 DIAGNOSIS — I10 Essential (primary) hypertension: Secondary | ICD-10-CM | POA: Diagnosis not present

## 2017-03-17 DIAGNOSIS — N135 Crossing vessel and stricture of ureter without hydronephrosis: Secondary | ICD-10-CM | POA: Diagnosis not present

## 2017-03-17 DIAGNOSIS — I2510 Atherosclerotic heart disease of native coronary artery without angina pectoris: Secondary | ICD-10-CM | POA: Diagnosis not present

## 2017-04-07 ENCOUNTER — Other Ambulatory Visit: Payer: Self-pay | Admitting: Sports Medicine

## 2017-04-07 DIAGNOSIS — D649 Anemia, unspecified: Secondary | ICD-10-CM

## 2017-04-09 ENCOUNTER — Other Ambulatory Visit: Payer: Self-pay | Admitting: Sports Medicine

## 2017-04-14 ENCOUNTER — Ambulatory Visit (INDEPENDENT_AMBULATORY_CARE_PROVIDER_SITE_OTHER): Payer: Medicare HMO | Admitting: Sports Medicine

## 2017-04-14 ENCOUNTER — Encounter: Payer: Self-pay | Admitting: Sports Medicine

## 2017-04-14 DIAGNOSIS — M1711 Unilateral primary osteoarthritis, right knee: Secondary | ICD-10-CM

## 2017-04-14 NOTE — Assessment & Plan Note (Signed)
20 mL arthrocentesis with injection. The previous injection was only 2 months ago. Return to see me as needed for this.

## 2017-04-14 NOTE — Progress Notes (Signed)
Subjective:    CC: Right knee pain  HPI: This is a pleasant 84 year old male, we aspirated and injected his knee about 2 months ago, he has a slight recurrence of pain and swelling.  Desires repeat interventional treatment today.  Symptoms are moderate, persistent, localized without radiation.  I reviewed the past medical history, family history, social history, surgical history, and allergies today and no changes were needed.  Please see the problem list section below in epic for further details.  Past Medical History: No past medical history on file. Past Surgical History: Past Surgical History:  Procedure Laterality Date  . APPENDECTOMY  05/03/2012  . CATARACT EXTRACTION Left 06/13/2011   eye  . CATARACT EXTRACTION Right 08/15/2011   eye  . HERNIA REPAIR  2009  . LUMBAR LAMINECTOMY/ DECOMPRESSION WITH MET-RX  2008  . mandibular tori  [phone]  . MAXIMUM ACCESS (MAS)POSTERIOR LUMBAR INTERBODY FUSION (PLIF) 1 LEVEL  01/12/2010  . [place]  . PROSTATECTOMY  1992  . RADICAL ORCHIECTOMY  2004  . removal of nasal polyps and sinus tissue  1997  . SHOULDER ARTHROSCOPY WITH SUBACROMIAL DECOMPRESSION, ROTATOR CUFF REPAIR AND BICEP TENDON REPAIR  1998  . thumb joint repair Left 1999  . thumb joint repair Right 2004  . TONSILECTOMY, ADENOIDECTOMY, BILATERAL MYRINGOTOMY AND TUBES  1940  . triple by-pass heart surgery  2006  . UVULOPALATOPHARYNGOPLASTY  1996   Social History: Social History   Socioeconomic History  . Marital status: Married    Spouse name: Not on file  . Number of children: Not on file  . Years of education: Not on file  . Highest education level: Not on file  Social Needs  . Financial resource strain: Not on file  . Food insecurity - worry: Not on file  . Food insecurity - inability: Not on file  . Transportation needs - medical: Not on file  . Transportation needs - non-medical: Not on file  Occupational History  . Not on file    Tobacco Use  . Smoking status: Former Research scientist (life sciences)  . Smokeless tobacco: Never Used  Substance and Sexual Activity  . Alcohol use: No  . Drug use: No  . Sexual activity: Not on file  Other Topics Concern  . Not on file  Social History Narrative  . Not on file   Family History: No family history on file. Allergies: Allergies  Allergen Reactions  . Aspirin Other (See Comments)    Unknown/Pt states unable to take due to stomach issues and anemia  . Metolazone Other (See Comments)    / Pt denies   Medications: See med rec.  Review of Systems: No fevers, chills, night sweats, weight loss, chest pain, or shortness of breath.   Objective:    General: Well Developed, well nourished, and in no acute distress.  Neuro: Alert and oriented x3, extra-ocular muscles intact, sensation grossly intact.  HEENT: Normocephalic, atraumatic, pupils equal round reactive to light, neck supple, no masses, no lymphadenopathy, thyroid nonpalpable.  Skin: Warm and dry, no rashes. Cardiac: Regular rate and rhythm, no murmurs rubs or gallops, no lower extremity edema.  Respiratory: Clear to auscultation bilaterally. Not using accessory muscles, speaking in full sentences. Right knee: Minimal effusion, mild tenderness at the medial joint line. ROM normal in flexion and extension and lower leg rotation. Ligaments with solid consistent endpoints including ACL, PCL, LCL, MCL. Negative Mcmurray's and provocative meniscal tests. Non painful patellar compression. Patellar and quadriceps tendons unremarkable. Hamstring and  quadriceps strength is normal.  Procedure: Real-time Ultrasound Guided aspiration/injection of right knee Device: GE Logiq E  Verbal informed consent obtained.  Time-out conducted.  Noted no overlying erythema, induration, or other signs of local infection.  Skin prepped in a sterile fashion.  Local anesthesia: Topical Ethyl chloride.  With sterile technique and under real time ultrasound  guidance: Using an 18-gauge needle I aspirated 20 mL of straw-colored clear fluid, syringe switched and 1 cc kenalog 40, 2 cc lidocaine, 2 cc bupivacaine injected easily Completed without difficulty  Pain immediately resolved suggesting accurate placement of the medication.  Advised to call if fevers/chills, erythema, induration, drainage, or persistent bleeding.  Images permanently stored and available for review in the ultrasound unit.  Impression: Technically successful ultrasound guided injection.  Impression and Recommendations:    Primary osteoarthritis of right knee 20 mL arthrocentesis with injection. The previous injection was only 2 months ago. Return to see me as needed for this. ___________________________________________ Gwen Her. Dianah Field, M.D., ABFM., CAQSM. Primary Care and [place] Instructor of [place] of [facility] of Medicine

## 2017-05-28 DIAGNOSIS — G4733 Obstructive sleep apnea (adult) (pediatric): Secondary | ICD-10-CM | POA: Diagnosis not present

## 2017-06-09 DIAGNOSIS — E785 Hyperlipidemia, unspecified: Secondary | ICD-10-CM | POA: Diagnosis not present

## 2017-06-09 DIAGNOSIS — N135 Crossing vessel and stricture of ureter without hydronephrosis: Secondary | ICD-10-CM | POA: Diagnosis not present

## 2017-06-09 DIAGNOSIS — N131 Hydronephrosis with ureteral stricture, not elsewhere classified: Secondary | ICD-10-CM | POA: Diagnosis not present

## 2017-06-09 DIAGNOSIS — Z8551 Personal history of malignant neoplasm of bladder: Secondary | ICD-10-CM | POA: Diagnosis not present

## 2017-06-09 DIAGNOSIS — I252 Old myocardial infarction: Secondary | ICD-10-CM | POA: Diagnosis not present

## 2017-06-09 DIAGNOSIS — Z960 Presence of urogenital implants: Secondary | ICD-10-CM | POA: Diagnosis not present

## 2017-06-09 DIAGNOSIS — Z951 Presence of aortocoronary bypass graft: Secondary | ICD-10-CM | POA: Diagnosis not present

## 2017-06-09 DIAGNOSIS — G4733 Obstructive sleep apnea (adult) (pediatric): Secondary | ICD-10-CM | POA: Diagnosis not present

## 2017-06-09 DIAGNOSIS — C679 Malignant neoplasm of bladder, unspecified: Secondary | ICD-10-CM | POA: Diagnosis not present

## 2017-06-09 DIAGNOSIS — N289 Disorder of kidney and ureter, unspecified: Secondary | ICD-10-CM | POA: Diagnosis not present

## 2017-06-09 DIAGNOSIS — N1330 Unspecified hydronephrosis: Secondary | ICD-10-CM | POA: Diagnosis not present

## 2017-06-09 DIAGNOSIS — I2510 Atherosclerotic heart disease of native coronary artery without angina pectoris: Secondary | ICD-10-CM | POA: Diagnosis not present

## 2017-06-09 DIAGNOSIS — K8020 Calculus of gallbladder without cholecystitis without obstruction: Secondary | ICD-10-CM | POA: Diagnosis not present

## 2017-06-09 DIAGNOSIS — E222 Syndrome of inappropriate secretion of antidiuretic hormone: Secondary | ICD-10-CM | POA: Diagnosis not present

## 2017-06-11 ENCOUNTER — Other Ambulatory Visit: Payer: Self-pay | Admitting: Sports Medicine

## 2017-06-11 DIAGNOSIS — I10 Essential (primary) hypertension: Secondary | ICD-10-CM

## 2017-06-20 ENCOUNTER — Ambulatory Visit (INDEPENDENT_AMBULATORY_CARE_PROVIDER_SITE_OTHER): Payer: Medicare HMO | Admitting: Sports Medicine

## 2017-06-20 ENCOUNTER — Encounter: Payer: Self-pay | Admitting: Sports Medicine

## 2017-06-20 DIAGNOSIS — M1711 Unilateral primary osteoarthritis, right knee: Secondary | ICD-10-CM

## 2017-06-20 DIAGNOSIS — M25512 Pain in left shoulder: Secondary | ICD-10-CM

## 2017-06-20 NOTE — Assessment & Plan Note (Signed)
Rotator cuff tear arthropathy, repeat glenohumeral injection today, previous injection was about 2 years ago.

## 2017-06-20 NOTE — Assessment & Plan Note (Signed)
25 cc arthrocentesis with injection, previous injection was in March.

## 2017-06-20 NOTE — Progress Notes (Signed)
Subjective:    CC: Left shoulder, right knee pain  HPI: Right knee arthritis: Increasing pain and swelling, previous injection was about 2-1/2 months ago.  Pain is moderate, persistent, localized without radiation.  Left shoulder pain: Rotator cuff tear arthropathy, last injection was about 2 years ago, having recurrence of pain, moderate, persistent, localized at the posterior joint line without radiation.  I reviewed the past medical history, family history, social history, surgical history, and allergies today and no changes were needed.  Please see the problem list section below in epic for further details.  Past Medical History: No past medical history on file. Past Surgical History: Past Surgical History:  Procedure Laterality Date  . APPENDECTOMY  05/03/2012  . CATARACT EXTRACTION Left 06/13/2011   eye  . CATARACT EXTRACTION Right 08/15/2011   eye  . HERNIA REPAIR  2009  . LUMBAR LAMINECTOMY/ DECOMPRESSION WITH MET-RX  2008  . mandibular tori  [phone]  . MAXIMUM ACCESS (MAS)POSTERIOR LUMBAR INTERBODY FUSION (PLIF) 1 LEVEL  01/12/2010  . [place]  . PROSTATECTOMY  1992  . RADICAL ORCHIECTOMY  2004  . removal of nasal polyps and sinus tissue  1997  . SHOULDER ARTHROSCOPY WITH SUBACROMIAL DECOMPRESSION, ROTATOR CUFF REPAIR AND BICEP TENDON REPAIR  1998  . thumb joint repair Left 1999  . thumb joint repair Right 2004  . TONSILECTOMY, ADENOIDECTOMY, BILATERAL MYRINGOTOMY AND TUBES  1940  . triple by-pass heart surgery  2006  . UVULOPALATOPHARYNGOPLASTY  1996   Social History: Social History   Socioeconomic History  . Marital status: Married    Spouse name: Not on file  . Number of children: Not on file  . Years of education: Not on file  . Highest education level: Not on file  Occupational History  . Not on file  Social Needs  . Financial resource strain: Not on file  . Food insecurity:    Worry: Not on file    Inability: Not on  file  . Transportation needs:    Medical: Not on file    Non-medical: Not on file  Tobacco Use  . Smoking status: Former Research scientist (life sciences)  . Smokeless tobacco: Never Used  Substance and Sexual Activity  . Alcohol use: No  . Drug use: No  . Sexual activity: Not on file  Lifestyle  . Physical activity:    Days per week: Not on file    Minutes per session: Not on file  . Stress: Not on file  Relationships  . Social connections:    Talks on phone: Not on file    Gets together: Not on file    Attends religious service: Not on file    Active member of club or organization: Not on file    Attends meetings of clubs or organizations: Not on file    Relationship status: Not on file  Other Topics Concern  . Not on file  Social History Narrative  . Not on file   Family History: No family history on file. Allergies: Allergies  Allergen Reactions  . Aspirin Other (See Comments)    Unknown/Pt states unable to take due to stomach issues and anemia  . Metolazone Other (See Comments)    / Pt denies   Medications: See med rec.  Review of Systems: No fevers, chills, night sweats, weight loss, chest pain, or shortness of breath.   Objective:    General: Well Developed, well nourished, and in no acute distress.  Neuro: Alert and oriented x3,  extra-ocular muscles intact, sensation grossly intact.  HEENT: Normocephalic, atraumatic, pupils equal round reactive to light, neck supple, no masses, no lymphadenopathy, thyroid nonpalpable.  Skin: Warm and dry, no rashes. Cardiac: Regular rate and rhythm, no murmurs rubs or gallops, no lower extremity edema.  Respiratory: Clear to auscultation bilaterally. Not using accessory muscles, speaking in full sentences.  Procedure: Real-time Ultrasound Guided aspiration/injection of left knee Device: GE Logiq E  Verbal informed consent obtained.  Time-out conducted.  Noted no overlying erythema, induration, or other signs of local infection.  Skin prepped in  a sterile fashion.  Local anesthesia: Topical Ethyl chloride.  With sterile technique and under real time ultrasound guidance: 25 mL of straw-colored fluid aspirated, syringe switched and 1 cc Kenalog 40, 2 cc lidocaine, 2 cc bupivacaine injected easily Completed without difficulty  Pain immediately resolved suggesting accurate placement of the medication.  Advised to call if fevers/chills, erythema, induration, drainage, or persistent bleeding.  Images permanently stored and available for review in the ultrasound unit.  Impression: Technically successful ultrasound guided injection.  Procedure: Real-time Ultrasound Guided Injection of left glenohumeral joint Device: GE Logiq E  Verbal informed consent obtained.  Time-out conducted.  Noted no overlying erythema, induration, or other signs of local infection.  Skin prepped in a sterile fashion.  Local anesthesia: Topical Ethyl chloride.  With sterile technique and under real time ultrasound guidance: 22-gauge spinal needle advanced into the joint, injected 1 cc Kenalog 40, 2 cc lidocaine, 2 cc bupivacaine. Completed without difficulty  Pain immediately resolved suggesting accurate placement of the medication.  Advised to call if fevers/chills, erythema, induration, drainage, or persistent bleeding.  Images permanently stored and available for review in the ultrasound unit.  Impression: Technically successful ultrasound guided injection.  Impression and Recommendations:    Rotator cuff tear arthropathy of left shoulder Rotator cuff tear arthropathy, repeat glenohumeral injection today, previous injection was about 2 years ago.  Primary osteoarthritis of right knee 25 cc arthrocentesis with injection, previous injection was in March. ___________________________________________ Gwen Her. Dianah Field, M.D., ABFM., CAQSM. Primary Care and [place] Instructor of [place] of [facility] of Medicine

## 2017-07-02 DIAGNOSIS — R918 Other nonspecific abnormal finding of lung field: Secondary | ICD-10-CM | POA: Diagnosis not present

## 2017-07-02 DIAGNOSIS — K5790 Diverticulosis of intestine, part unspecified, without perforation or abscess without bleeding: Secondary | ICD-10-CM | POA: Diagnosis not present

## 2017-07-02 DIAGNOSIS — R911 Solitary pulmonary nodule: Secondary | ICD-10-CM | POA: Diagnosis not present

## 2017-07-02 DIAGNOSIS — N1330 Unspecified hydronephrosis: Secondary | ICD-10-CM | POA: Diagnosis not present

## 2017-07-02 DIAGNOSIS — N131 Hydronephrosis with ureteral stricture, not elsewhere classified: Secondary | ICD-10-CM | POA: Diagnosis not present

## 2017-07-02 DIAGNOSIS — K5900 Constipation, unspecified: Secondary | ICD-10-CM | POA: Diagnosis not present

## 2017-07-02 DIAGNOSIS — K7689 Other specified diseases of liver: Secondary | ICD-10-CM | POA: Diagnosis not present

## 2017-07-02 DIAGNOSIS — C679 Malignant neoplasm of bladder, unspecified: Secondary | ICD-10-CM | POA: Diagnosis not present

## 2017-07-02 DIAGNOSIS — C678 Malignant neoplasm of overlapping sites of bladder: Secondary | ICD-10-CM | POA: Diagnosis not present

## 2017-07-03 ENCOUNTER — Encounter: Payer: Self-pay | Admitting: Sports Medicine

## 2017-07-03 ENCOUNTER — Ambulatory Visit (INDEPENDENT_AMBULATORY_CARE_PROVIDER_SITE_OTHER): Payer: Medicare HMO | Admitting: Sports Medicine

## 2017-07-03 DIAGNOSIS — J189 Pneumonia, unspecified organism: Secondary | ICD-10-CM | POA: Insufficient documentation

## 2017-07-03 DIAGNOSIS — M1711 Unilateral primary osteoarthritis, right knee: Secondary | ICD-10-CM

## 2017-07-03 DIAGNOSIS — J181 Lobar pneumonia, unspecified organism: Secondary | ICD-10-CM

## 2017-07-03 HISTORY — DX: Pneumonia, unspecified organism: J18.9

## 2017-07-03 MED ORDER — MOXIFLOXACIN HCL 400 MG PO TABS: 400.0000 mg | ORAL_TABLET | Freq: Every day | ORAL | 0 refills | Status: AC

## 2017-07-03 MED ORDER — HYDROCODONE-ACETAMINOPHEN 7.5-325 MG PO TABS: 1.0000 | ORAL_TABLET | Freq: Three times a day (TID) | ORAL | 0 refills | Status: DC | PRN

## 2017-07-03 NOTE — Assessment & Plan Note (Addendum)
Multiple episodes of hospitalizations, treating this as an HCAP. Surprisingly clear lungs on exam. Avelox return to see me in a week.

## 2017-07-03 NOTE — Progress Notes (Signed)
Subjective:    CC: Question pneumonia  HPI: Jeffery Hart is a very pleasant 84 year old male with bladder cancer, he was seen by his oncologist, he had a bit of cough/shortness of breath, and felt tired, he was sent immediately for a CT abdomen and pelvis but also for chest x-ray, chest x-ray showed a right lower lobe infiltrate.  He was referred to me for further evaluation of this, he was also referred back to his urologist, his CT abdomen and pelvis did show bilateral hydronephrosis in spite of bilateral ureteral stenting.  I reviewed the past medical history, family history, social history, surgical history, and allergies today and no changes were needed.  Please see the problem list section below in epic for further details.  Past Medical History: No past medical history on file. Past Surgical History: Past Surgical History:  Procedure Laterality Date  . APPENDECTOMY  05/03/2012  . CATARACT EXTRACTION Left 06/13/2011   eye  . CATARACT EXTRACTION Right 08/15/2011   eye  . HERNIA REPAIR  2009  . LUMBAR LAMINECTOMY/ DECOMPRESSION WITH MET-RX  2008  . mandibular tori  [phone]  . MAXIMUM ACCESS (MAS)POSTERIOR LUMBAR INTERBODY FUSION (PLIF) 1 LEVEL  01/12/2010  . [place]  . PROSTATECTOMY  1992  . RADICAL ORCHIECTOMY  2004  . removal of nasal polyps and sinus tissue  1997  . SHOULDER ARTHROSCOPY WITH SUBACROMIAL DECOMPRESSION, ROTATOR CUFF REPAIR AND BICEP TENDON REPAIR  1998  . thumb joint repair Left 1999  . thumb joint repair Right 2004  . TONSILECTOMY, ADENOIDECTOMY, BILATERAL MYRINGOTOMY AND TUBES  1940  . triple by-pass heart surgery  2006  . UVULOPALATOPHARYNGOPLASTY  1996   Social History: Social History   Socioeconomic History  . Marital status: Married    Spouse name: Not on file  . Number of children: Not on file  . Years of education: Not on file  . Highest education level: Not on file  Occupational History  . Not on file  Social Needs   . Financial resource strain: Not on file  . Food insecurity:    Worry: Not on file    Inability: Not on file  . Transportation needs:    Medical: Not on file    Non-medical: Not on file  Tobacco Use  . Smoking status: Former Research scientist (life sciences)  . Smokeless tobacco: Never Used  Substance and Sexual Activity  . Alcohol use: No  . Drug use: No  . Sexual activity: Not on file  Lifestyle  . Physical activity:    Days per week: Not on file    Minutes per session: Not on file  . Stress: Not on file  Relationships  . Social connections:    Talks on phone: Not on file    Gets together: Not on file    Attends religious service: Not on file    Active member of club or organization: Not on file    Attends meetings of clubs or organizations: Not on file    Relationship status: Not on file  Other Topics Concern  . Not on file  Social History Narrative  . Not on file   Family History: No family history on file. Allergies: Allergies  Allergen Reactions  . Aspirin Other (See Comments)    Unknown/Pt states unable to take due to stomach issues and anemia  . Metolazone Other (See Comments)    / Pt denies   Medications: See med rec.  Review of Systems: No fevers, chills, night sweats,  weight loss, chest pain, or shortness of breath.   Objective:    General: Well Developed, well nourished, and in no acute distress.  Neuro: Alert and oriented x3, extra-ocular muscles intact, sensation grossly intact.  HEENT: Normocephalic, atraumatic, pupils equal round reactive to light, neck supple, no masses, no lymphadenopathy, thyroid nonpalpable.  Skin: Warm and dry, no rashes. Cardiac: Regular rate and rhythm, no murmurs rubs or gallops, no lower extremity edema.  Respiratory: Clear to auscultation bilaterally. Not using accessory muscles, speaking in full sentences.  Impression and Recommendations:    Right lower lobe pneumonia ([place]) Multiple episodes of hospitalizations, treating this as an  HCAP. Surprisingly clear lungs on exam. Avelox return to see me in a week.  I spent 25 minutes with this patient, greater than 50% was face-to-face time counseling regarding the above diagnoses ___________________________________________ Gwen Her. Dianah Field, M.D., ABFM., CAQSM. Primary Care and [place] Instructor of [place] of [facility] of Medicine

## 2017-07-09 DIAGNOSIS — I517 Cardiomegaly: Secondary | ICD-10-CM | POA: Diagnosis not present

## 2017-07-09 DIAGNOSIS — R7881 Bacteremia: Secondary | ICD-10-CM | POA: Diagnosis not present

## 2017-07-09 DIAGNOSIS — I10 Essential (primary) hypertension: Secondary | ICD-10-CM | POA: Diagnosis not present

## 2017-07-09 DIAGNOSIS — I2510 Atherosclerotic heart disease of native coronary artery without angina pectoris: Secondary | ICD-10-CM | POA: Diagnosis not present

## 2017-07-09 DIAGNOSIS — R404 Transient alteration of awareness: Secondary | ICD-10-CM | POA: Diagnosis not present

## 2017-07-09 DIAGNOSIS — A4102 Sepsis due to Methicillin resistant Staphylococcus aureus: Secondary | ICD-10-CM | POA: Diagnosis not present

## 2017-07-09 DIAGNOSIS — M25461 Effusion, right knee: Secondary | ICD-10-CM | POA: Diagnosis not present

## 2017-07-09 DIAGNOSIS — I6523 Occlusion and stenosis of bilateral carotid arteries: Secondary | ICD-10-CM | POA: Diagnosis not present

## 2017-07-09 DIAGNOSIS — C679 Malignant neoplasm of bladder, unspecified: Secondary | ICD-10-CM | POA: Diagnosis not present

## 2017-07-09 DIAGNOSIS — R0902 Hypoxemia: Secondary | ICD-10-CM | POA: Diagnosis not present

## 2017-07-09 DIAGNOSIS — R402441 Other coma, without documented Glasgow coma scale score, or with partial score reported, in the field [EMT or ambulance]: Secondary | ICD-10-CM | POA: Diagnosis not present

## 2017-07-09 DIAGNOSIS — I080 Rheumatic disorders of both mitral and aortic valves: Secondary | ICD-10-CM | POA: Diagnosis not present

## 2017-07-09 DIAGNOSIS — T83592A Infection and inflammatory reaction due to indwelling ureteral stent, initial encounter: Secondary | ICD-10-CM | POA: Diagnosis not present

## 2017-07-09 DIAGNOSIS — I519 Heart disease, unspecified: Secondary | ICD-10-CM | POA: Diagnosis not present

## 2017-07-09 DIAGNOSIS — B9562 Methicillin resistant Staphylococcus aureus infection as the cause of diseases classified elsewhere: Secondary | ICD-10-CM | POA: Diagnosis not present

## 2017-07-09 DIAGNOSIS — Z951 Presence of aortocoronary bypass graft: Secondary | ICD-10-CM | POA: Diagnosis not present

## 2017-07-09 DIAGNOSIS — N136 Pyonephrosis: Secondary | ICD-10-CM | POA: Diagnosis not present

## 2017-07-09 DIAGNOSIS — Z452 Encounter for adjustment and management of vascular access device: Secondary | ICD-10-CM | POA: Diagnosis not present

## 2017-07-09 DIAGNOSIS — N3000 Acute cystitis without hematuria: Secondary | ICD-10-CM | POA: Diagnosis not present

## 2017-07-09 DIAGNOSIS — G4733 Obstructive sleep apnea (adult) (pediatric): Secondary | ICD-10-CM | POA: Diagnosis not present

## 2017-07-09 DIAGNOSIS — I5032 Chronic diastolic (congestive) heart failure: Secondary | ICD-10-CM | POA: Diagnosis not present

## 2017-07-09 DIAGNOSIS — J189 Pneumonia, unspecified organism: Secondary | ICD-10-CM | POA: Diagnosis not present

## 2017-07-09 DIAGNOSIS — R55 Syncope and collapse: Secondary | ICD-10-CM | POA: Diagnosis not present

## 2017-07-09 DIAGNOSIS — R001 Bradycardia, unspecified: Secondary | ICD-10-CM | POA: Diagnosis not present

## 2017-07-09 DIAGNOSIS — I959 Hypotension, unspecified: Secondary | ICD-10-CM | POA: Diagnosis not present

## 2017-07-09 DIAGNOSIS — R4182 Altered mental status, unspecified: Secondary | ICD-10-CM | POA: Diagnosis not present

## 2017-07-09 DIAGNOSIS — I2583 Coronary atherosclerosis due to lipid rich plaque: Secondary | ICD-10-CM | POA: Diagnosis not present

## 2017-07-09 DIAGNOSIS — M25569 Pain in unspecified knee: Secondary | ICD-10-CM | POA: Diagnosis not present

## 2017-07-09 DIAGNOSIS — N1330 Unspecified hydronephrosis: Secondary | ICD-10-CM | POA: Diagnosis not present

## 2017-07-15 DIAGNOSIS — Z452 Encounter for adjustment and management of vascular access device: Secondary | ICD-10-CM | POA: Diagnosis not present

## 2017-07-15 DIAGNOSIS — A4102 Sepsis due to Methicillin resistant Staphylococcus aureus: Secondary | ICD-10-CM | POA: Diagnosis not present

## 2017-07-15 DIAGNOSIS — I5032 Chronic diastolic (congestive) heart failure: Secondary | ICD-10-CM | POA: Diagnosis not present

## 2017-07-15 DIAGNOSIS — K219 Gastro-esophageal reflux disease without esophagitis: Secondary | ICD-10-CM | POA: Diagnosis not present

## 2017-07-15 DIAGNOSIS — G4733 Obstructive sleep apnea (adult) (pediatric): Secondary | ICD-10-CM | POA: Diagnosis not present

## 2017-07-15 DIAGNOSIS — I2510 Atherosclerotic heart disease of native coronary artery without angina pectoris: Secondary | ICD-10-CM | POA: Diagnosis not present

## 2017-07-15 DIAGNOSIS — N3090 Cystitis, unspecified without hematuria: Secondary | ICD-10-CM | POA: Diagnosis not present

## 2017-07-15 DIAGNOSIS — I110 Hypertensive heart disease with heart failure: Secondary | ICD-10-CM | POA: Diagnosis not present

## 2017-07-15 DIAGNOSIS — H9190 Unspecified hearing loss, unspecified ear: Secondary | ICD-10-CM | POA: Diagnosis not present

## 2017-07-15 DIAGNOSIS — G609 Hereditary and idiopathic neuropathy, unspecified: Secondary | ICD-10-CM | POA: Diagnosis not present

## 2017-07-16 DIAGNOSIS — Z452 Encounter for adjustment and management of vascular access device: Secondary | ICD-10-CM | POA: Diagnosis not present

## 2017-07-16 DIAGNOSIS — G4733 Obstructive sleep apnea (adult) (pediatric): Secondary | ICD-10-CM | POA: Diagnosis not present

## 2017-07-16 DIAGNOSIS — A4102 Sepsis due to Methicillin resistant Staphylococcus aureus: Secondary | ICD-10-CM | POA: Diagnosis not present

## 2017-07-16 DIAGNOSIS — I2510 Atherosclerotic heart disease of native coronary artery without angina pectoris: Secondary | ICD-10-CM | POA: Diagnosis not present

## 2017-07-16 DIAGNOSIS — H9190 Unspecified hearing loss, unspecified ear: Secondary | ICD-10-CM | POA: Diagnosis not present

## 2017-07-16 DIAGNOSIS — I5032 Chronic diastolic (congestive) heart failure: Secondary | ICD-10-CM | POA: Diagnosis not present

## 2017-07-16 DIAGNOSIS — I110 Hypertensive heart disease with heart failure: Secondary | ICD-10-CM | POA: Diagnosis not present

## 2017-07-16 DIAGNOSIS — K219 Gastro-esophageal reflux disease without esophagitis: Secondary | ICD-10-CM | POA: Diagnosis not present

## 2017-07-16 DIAGNOSIS — G609 Hereditary and idiopathic neuropathy, unspecified: Secondary | ICD-10-CM | POA: Diagnosis not present

## 2017-07-17 ENCOUNTER — Ambulatory Visit: Payer: Medicare HMO | Admitting: Sports Medicine

## 2017-07-17 DIAGNOSIS — I5032 Chronic diastolic (congestive) heart failure: Secondary | ICD-10-CM | POA: Diagnosis not present

## 2017-07-17 DIAGNOSIS — G4733 Obstructive sleep apnea (adult) (pediatric): Secondary | ICD-10-CM | POA: Diagnosis not present

## 2017-07-17 DIAGNOSIS — I110 Hypertensive heart disease with heart failure: Secondary | ICD-10-CM | POA: Diagnosis not present

## 2017-07-17 DIAGNOSIS — I2510 Atherosclerotic heart disease of native coronary artery without angina pectoris: Secondary | ICD-10-CM | POA: Diagnosis not present

## 2017-07-17 DIAGNOSIS — Z452 Encounter for adjustment and management of vascular access device: Secondary | ICD-10-CM | POA: Diagnosis not present

## 2017-07-17 DIAGNOSIS — A4102 Sepsis due to Methicillin resistant Staphylococcus aureus: Secondary | ICD-10-CM | POA: Diagnosis not present

## 2017-07-17 DIAGNOSIS — G609 Hereditary and idiopathic neuropathy, unspecified: Secondary | ICD-10-CM | POA: Diagnosis not present

## 2017-07-17 DIAGNOSIS — I509 Heart failure, unspecified: Secondary | ICD-10-CM | POA: Diagnosis not present

## 2017-07-17 DIAGNOSIS — K219 Gastro-esophageal reflux disease without esophagitis: Secondary | ICD-10-CM | POA: Diagnosis not present

## 2017-07-17 DIAGNOSIS — H9190 Unspecified hearing loss, unspecified ear: Secondary | ICD-10-CM | POA: Diagnosis not present

## 2017-07-20 DIAGNOSIS — A4102 Sepsis due to Methicillin resistant Staphylococcus aureus: Secondary | ICD-10-CM | POA: Diagnosis not present

## 2017-07-20 DIAGNOSIS — N3090 Cystitis, unspecified without hematuria: Secondary | ICD-10-CM | POA: Diagnosis not present

## 2017-07-21 DIAGNOSIS — I110 Hypertensive heart disease with heart failure: Secondary | ICD-10-CM | POA: Diagnosis not present

## 2017-07-21 DIAGNOSIS — I509 Heart failure, unspecified: Secondary | ICD-10-CM | POA: Diagnosis not present

## 2017-07-21 DIAGNOSIS — Z452 Encounter for adjustment and management of vascular access device: Secondary | ICD-10-CM | POA: Diagnosis not present

## 2017-07-21 DIAGNOSIS — G4733 Obstructive sleep apnea (adult) (pediatric): Secondary | ICD-10-CM | POA: Diagnosis not present

## 2017-07-21 DIAGNOSIS — G609 Hereditary and idiopathic neuropathy, unspecified: Secondary | ICD-10-CM | POA: Diagnosis not present

## 2017-07-21 DIAGNOSIS — H9190 Unspecified hearing loss, unspecified ear: Secondary | ICD-10-CM | POA: Diagnosis not present

## 2017-07-21 DIAGNOSIS — K219 Gastro-esophageal reflux disease without esophagitis: Secondary | ICD-10-CM | POA: Diagnosis not present

## 2017-07-21 DIAGNOSIS — A4102 Sepsis due to Methicillin resistant Staphylococcus aureus: Secondary | ICD-10-CM | POA: Diagnosis not present

## 2017-07-21 DIAGNOSIS — I2510 Atherosclerotic heart disease of native coronary artery without angina pectoris: Secondary | ICD-10-CM | POA: Diagnosis not present

## 2017-07-21 DIAGNOSIS — I5032 Chronic diastolic (congestive) heart failure: Secondary | ICD-10-CM | POA: Diagnosis not present

## 2017-07-24 DIAGNOSIS — G4733 Obstructive sleep apnea (adult) (pediatric): Secondary | ICD-10-CM | POA: Diagnosis not present

## 2017-07-24 DIAGNOSIS — Z452 Encounter for adjustment and management of vascular access device: Secondary | ICD-10-CM | POA: Diagnosis not present

## 2017-07-24 DIAGNOSIS — H9190 Unspecified hearing loss, unspecified ear: Secondary | ICD-10-CM | POA: Diagnosis not present

## 2017-07-24 DIAGNOSIS — G609 Hereditary and idiopathic neuropathy, unspecified: Secondary | ICD-10-CM | POA: Diagnosis not present

## 2017-07-24 DIAGNOSIS — I2510 Atherosclerotic heart disease of native coronary artery without angina pectoris: Secondary | ICD-10-CM | POA: Diagnosis not present

## 2017-07-24 DIAGNOSIS — K219 Gastro-esophageal reflux disease without esophagitis: Secondary | ICD-10-CM | POA: Diagnosis not present

## 2017-07-24 DIAGNOSIS — I5032 Chronic diastolic (congestive) heart failure: Secondary | ICD-10-CM | POA: Diagnosis not present

## 2017-07-24 DIAGNOSIS — I110 Hypertensive heart disease with heart failure: Secondary | ICD-10-CM | POA: Diagnosis not present

## 2017-07-24 DIAGNOSIS — A4102 Sepsis due to Methicillin resistant Staphylococcus aureus: Secondary | ICD-10-CM | POA: Diagnosis not present

## 2017-07-26 DIAGNOSIS — A4102 Sepsis due to Methicillin resistant Staphylococcus aureus: Secondary | ICD-10-CM | POA: Diagnosis not present

## 2017-07-26 DIAGNOSIS — N3090 Cystitis, unspecified without hematuria: Secondary | ICD-10-CM | POA: Diagnosis not present

## 2017-07-28 DIAGNOSIS — A4102 Sepsis due to Methicillin resistant Staphylococcus aureus: Secondary | ICD-10-CM | POA: Diagnosis not present

## 2017-07-28 DIAGNOSIS — Z452 Encounter for adjustment and management of vascular access device: Secondary | ICD-10-CM | POA: Diagnosis not present

## 2017-07-28 DIAGNOSIS — I110 Hypertensive heart disease with heart failure: Secondary | ICD-10-CM | POA: Diagnosis not present

## 2017-07-28 DIAGNOSIS — I5032 Chronic diastolic (congestive) heart failure: Secondary | ICD-10-CM | POA: Diagnosis not present

## 2017-07-28 DIAGNOSIS — G4733 Obstructive sleep apnea (adult) (pediatric): Secondary | ICD-10-CM | POA: Diagnosis not present

## 2017-07-28 DIAGNOSIS — G609 Hereditary and idiopathic neuropathy, unspecified: Secondary | ICD-10-CM | POA: Diagnosis not present

## 2017-07-28 DIAGNOSIS — I2510 Atherosclerotic heart disease of native coronary artery without angina pectoris: Secondary | ICD-10-CM | POA: Diagnosis not present

## 2017-07-28 DIAGNOSIS — H9190 Unspecified hearing loss, unspecified ear: Secondary | ICD-10-CM | POA: Diagnosis not present

## 2017-07-28 DIAGNOSIS — K219 Gastro-esophageal reflux disease without esophagitis: Secondary | ICD-10-CM | POA: Diagnosis not present

## 2017-07-30 DIAGNOSIS — G609 Hereditary and idiopathic neuropathy, unspecified: Secondary | ICD-10-CM | POA: Diagnosis not present

## 2017-07-30 DIAGNOSIS — K219 Gastro-esophageal reflux disease without esophagitis: Secondary | ICD-10-CM | POA: Diagnosis not present

## 2017-07-30 DIAGNOSIS — G4733 Obstructive sleep apnea (adult) (pediatric): Secondary | ICD-10-CM | POA: Diagnosis not present

## 2017-07-30 DIAGNOSIS — Z452 Encounter for adjustment and management of vascular access device: Secondary | ICD-10-CM | POA: Diagnosis not present

## 2017-07-30 DIAGNOSIS — I2510 Atherosclerotic heart disease of native coronary artery without angina pectoris: Secondary | ICD-10-CM | POA: Diagnosis not present

## 2017-07-30 DIAGNOSIS — I5032 Chronic diastolic (congestive) heart failure: Secondary | ICD-10-CM | POA: Diagnosis not present

## 2017-07-30 DIAGNOSIS — H9190 Unspecified hearing loss, unspecified ear: Secondary | ICD-10-CM | POA: Diagnosis not present

## 2017-07-30 DIAGNOSIS — I110 Hypertensive heart disease with heart failure: Secondary | ICD-10-CM | POA: Diagnosis not present

## 2017-07-30 DIAGNOSIS — A4102 Sepsis due to Methicillin resistant Staphylococcus aureus: Secondary | ICD-10-CM | POA: Diagnosis not present

## 2017-07-30 DIAGNOSIS — C679 Malignant neoplasm of bladder, unspecified: Secondary | ICD-10-CM | POA: Diagnosis not present

## 2017-07-31 IMAGING — DX DG ABDOMEN 2V
3 series · 3 of 3 positions shown · non-contrast
Comparison: Ultrasound the abdomen of 02/16/2013

CLINICAL DATA: Suprapubic abdominal pain, diarrhea over the last 2
weeks, history of bladder carcinoma

EXAM:
ABDOMEN - 2 VIEW

[abdomen erect]
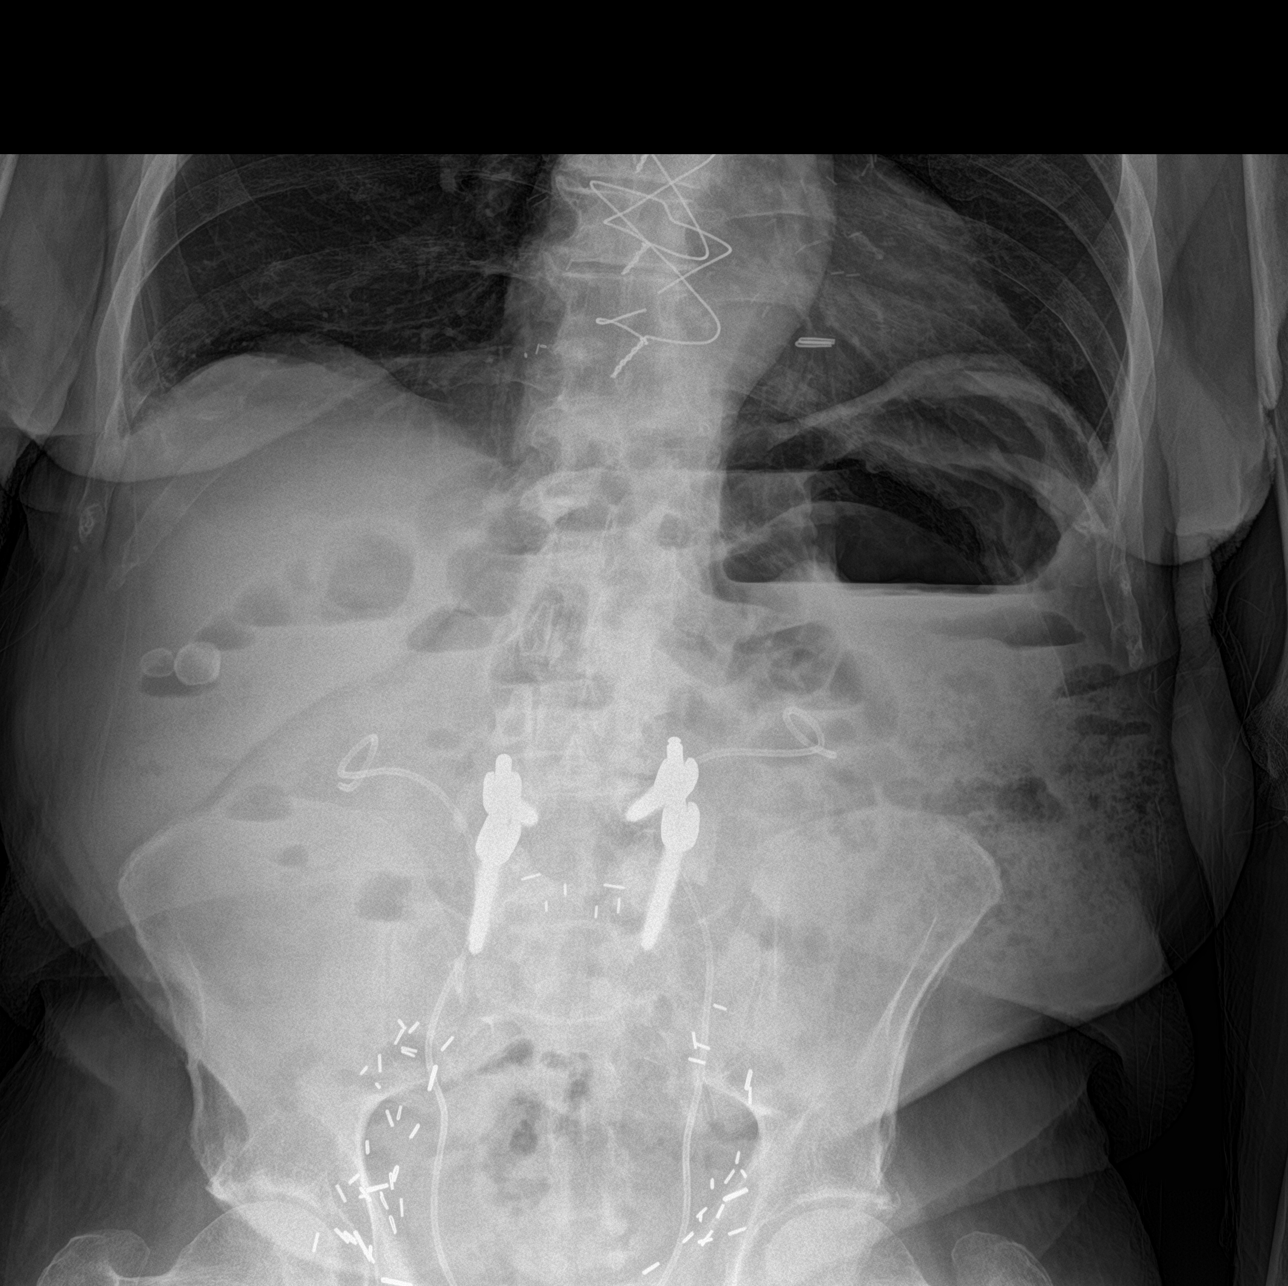

[abdomen supine (1 of 2)]
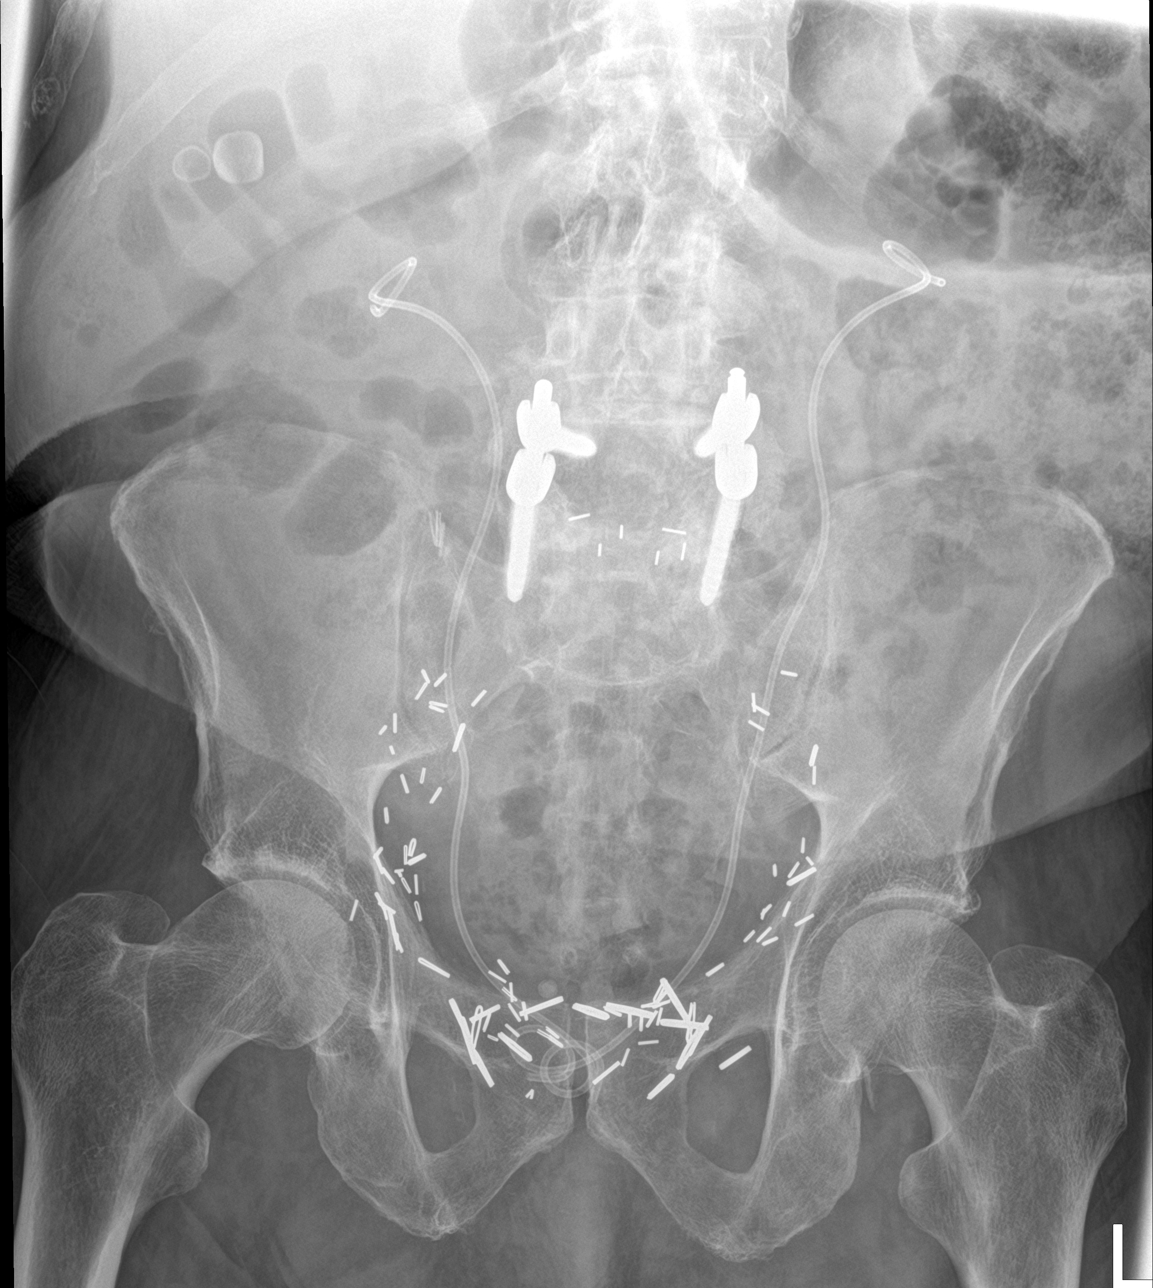

[abdomen supine (2 of 2)]
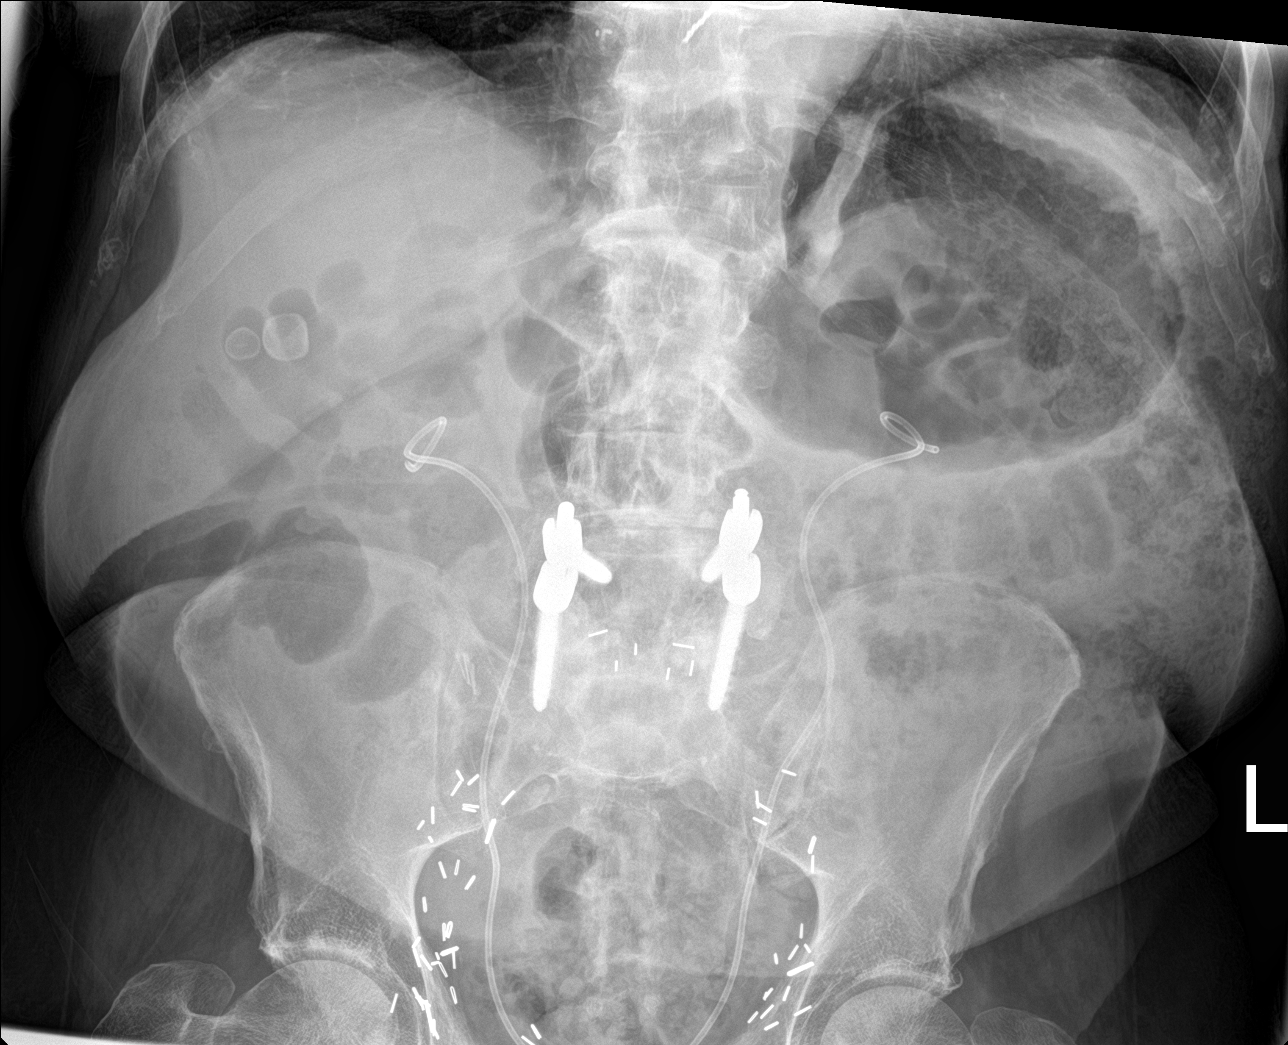

[3 of 3 positions shown; findings below may reference images not displayed]

FINDINGS: Supine and erect views of the abdomen show scattered air-fluid
levels primarily within the colon. Some small bowel air-fluid levels
may well be present and a partial small bowel obstruction or ileus
would be the primary considerations. No free air is seen. CT of the
abdomen pelvis would be helpful to assess more sensitively if
necessary clinically. Bilateral double-J ureteral stents are
present. Multiple surgical clips are present in the pelvis from
prior prostatectomy. Hardware for fusion of the lower lumbar spine
is present. Incidental gallstones are noted within the right upper
quadrant.
IMPRESSION: 1. Scattered air-fluid levels. Possible ileus but cannot exclude
partial bowel obstruction. No free air.
2. Bilateral double-J ureteral stents.
3. Incidental gallstones.

## 2017-08-02 DIAGNOSIS — A4102 Sepsis due to Methicillin resistant Staphylococcus aureus: Secondary | ICD-10-CM | POA: Diagnosis not present

## 2017-08-02 DIAGNOSIS — N3090 Cystitis, unspecified without hematuria: Secondary | ICD-10-CM | POA: Diagnosis not present

## 2017-08-04 DIAGNOSIS — Z452 Encounter for adjustment and management of vascular access device: Secondary | ICD-10-CM | POA: Diagnosis not present

## 2017-08-04 DIAGNOSIS — I5032 Chronic diastolic (congestive) heart failure: Secondary | ICD-10-CM | POA: Diagnosis not present

## 2017-08-04 DIAGNOSIS — G4733 Obstructive sleep apnea (adult) (pediatric): Secondary | ICD-10-CM | POA: Diagnosis not present

## 2017-08-04 DIAGNOSIS — H9190 Unspecified hearing loss, unspecified ear: Secondary | ICD-10-CM | POA: Diagnosis not present

## 2017-08-04 DIAGNOSIS — K219 Gastro-esophageal reflux disease without esophagitis: Secondary | ICD-10-CM | POA: Diagnosis not present

## 2017-08-04 DIAGNOSIS — G609 Hereditary and idiopathic neuropathy, unspecified: Secondary | ICD-10-CM | POA: Diagnosis not present

## 2017-08-04 DIAGNOSIS — I2510 Atherosclerotic heart disease of native coronary artery without angina pectoris: Secondary | ICD-10-CM | POA: Diagnosis not present

## 2017-08-04 DIAGNOSIS — I110 Hypertensive heart disease with heart failure: Secondary | ICD-10-CM | POA: Diagnosis not present

## 2017-08-04 DIAGNOSIS — A4102 Sepsis due to Methicillin resistant Staphylococcus aureus: Secondary | ICD-10-CM | POA: Diagnosis not present

## 2017-08-07 ENCOUNTER — Other Ambulatory Visit: Payer: Self-pay | Admitting: Sports Medicine

## 2017-08-07 DIAGNOSIS — H9190 Unspecified hearing loss, unspecified ear: Secondary | ICD-10-CM | POA: Diagnosis not present

## 2017-08-07 DIAGNOSIS — Z452 Encounter for adjustment and management of vascular access device: Secondary | ICD-10-CM | POA: Diagnosis not present

## 2017-08-07 DIAGNOSIS — I110 Hypertensive heart disease with heart failure: Secondary | ICD-10-CM | POA: Diagnosis not present

## 2017-08-07 DIAGNOSIS — A4102 Sepsis due to Methicillin resistant Staphylococcus aureus: Secondary | ICD-10-CM | POA: Diagnosis not present

## 2017-08-07 DIAGNOSIS — I2510 Atherosclerotic heart disease of native coronary artery without angina pectoris: Secondary | ICD-10-CM | POA: Diagnosis not present

## 2017-08-07 DIAGNOSIS — G609 Hereditary and idiopathic neuropathy, unspecified: Secondary | ICD-10-CM | POA: Diagnosis not present

## 2017-08-07 DIAGNOSIS — K219 Gastro-esophageal reflux disease without esophagitis: Secondary | ICD-10-CM | POA: Diagnosis not present

## 2017-08-07 DIAGNOSIS — I5032 Chronic diastolic (congestive) heart failure: Secondary | ICD-10-CM | POA: Diagnosis not present

## 2017-08-07 DIAGNOSIS — G4733 Obstructive sleep apnea (adult) (pediatric): Secondary | ICD-10-CM | POA: Diagnosis not present

## 2017-08-09 DIAGNOSIS — A4102 Sepsis due to Methicillin resistant Staphylococcus aureus: Secondary | ICD-10-CM | POA: Diagnosis not present

## 2017-08-09 DIAGNOSIS — N3090 Cystitis, unspecified without hematuria: Secondary | ICD-10-CM | POA: Diagnosis not present

## 2017-08-11 DIAGNOSIS — G609 Hereditary and idiopathic neuropathy, unspecified: Secondary | ICD-10-CM | POA: Diagnosis not present

## 2017-08-11 DIAGNOSIS — I110 Hypertensive heart disease with heart failure: Secondary | ICD-10-CM | POA: Diagnosis not present

## 2017-08-11 DIAGNOSIS — H9190 Unspecified hearing loss, unspecified ear: Secondary | ICD-10-CM | POA: Diagnosis not present

## 2017-08-11 DIAGNOSIS — Z452 Encounter for adjustment and management of vascular access device: Secondary | ICD-10-CM | POA: Diagnosis not present

## 2017-08-11 DIAGNOSIS — I2510 Atherosclerotic heart disease of native coronary artery without angina pectoris: Secondary | ICD-10-CM | POA: Diagnosis not present

## 2017-08-11 DIAGNOSIS — A4102 Sepsis due to Methicillin resistant Staphylococcus aureus: Secondary | ICD-10-CM | POA: Diagnosis not present

## 2017-08-11 DIAGNOSIS — I5032 Chronic diastolic (congestive) heart failure: Secondary | ICD-10-CM | POA: Diagnosis not present

## 2017-08-11 DIAGNOSIS — G4733 Obstructive sleep apnea (adult) (pediatric): Secondary | ICD-10-CM | POA: Diagnosis not present

## 2017-08-11 DIAGNOSIS — K219 Gastro-esophageal reflux disease without esophagitis: Secondary | ICD-10-CM | POA: Diagnosis not present

## 2017-08-12 ENCOUNTER — Ambulatory Visit (INDEPENDENT_AMBULATORY_CARE_PROVIDER_SITE_OTHER): Payer: Medicare HMO | Admitting: Sports Medicine

## 2017-08-12 ENCOUNTER — Encounter: Payer: Self-pay | Admitting: Sports Medicine

## 2017-08-12 DIAGNOSIS — M1711 Unilateral primary osteoarthritis, right knee: Secondary | ICD-10-CM

## 2017-08-12 NOTE — Progress Notes (Signed)
Subjective:    CC: Right knee swelling  HPI: This is a pleasant 84 year old male with known right knee osteoarthritis, we lasted an arthrocentesis in May, having a recurrence of swelling, moderate, persistent without radiation, mechanical symptoms or constitutional symptoms.  I reviewed the past medical history, family history, social history, surgical history, and allergies today and no changes were needed.  Please see the problem list section below in epic for further details.  Past Medical History: No past medical history on file. Past Surgical History: Past Surgical History:  Procedure Laterality Date  . APPENDECTOMY  05/03/2012  . CATARACT EXTRACTION Left 06/13/2011   eye  . CATARACT EXTRACTION Right 08/15/2011   eye  . HERNIA REPAIR  2009  . LUMBAR LAMINECTOMY/ DECOMPRESSION WITH MET-RX  2008  . mandibular tori  [phone]  . MAXIMUM ACCESS (MAS)POSTERIOR LUMBAR INTERBODY FUSION (PLIF) 1 LEVEL  01/12/2010  . [place]  . PROSTATECTOMY  1992  . RADICAL ORCHIECTOMY  2004  . removal of nasal polyps and sinus tissue  1997  . SHOULDER ARTHROSCOPY WITH SUBACROMIAL DECOMPRESSION, ROTATOR CUFF REPAIR AND BICEP TENDON REPAIR  1998  . thumb joint repair Left 1999  . thumb joint repair Right 2004  . TONSILECTOMY, ADENOIDECTOMY, BILATERAL MYRINGOTOMY AND TUBES  1940  . triple by-pass heart surgery  2006  . UVULOPALATOPHARYNGOPLASTY  1996   Social History: Social History   Socioeconomic History  . Marital status: Married    Spouse name: Not on file  . Number of children: Not on file  . Years of education: Not on file  . Highest education level: Not on file  Occupational History  . Not on file  Social Needs  . Financial resource strain: Not on file  . Food insecurity:    Worry: Not on file    Inability: Not on file  . Transportation needs:    Medical: Not on file    Non-medical: Not on file  Tobacco Use  . Smoking status: Former Research scientist (life sciences)  .  Smokeless tobacco: Never Used  Substance and Sexual Activity  . Alcohol use: No  . Drug use: No  . Sexual activity: Not on file  Lifestyle  . Physical activity:    Days per week: Not on file    Minutes per session: Not on file  . Stress: Not on file  Relationships  . Social connections:    Talks on phone: Not on file    Gets together: Not on file    Attends religious service: Not on file    Active member of club or organization: Not on file    Attends meetings of clubs or organizations: Not on file    Relationship status: Not on file  Other Topics Concern  . Not on file  Social History Narrative  . Not on file   Family History: No family history on file. Allergies: Allergies  Allergen Reactions  . Aspirin Other (See Comments)    Unknown/Pt states unable to take due to stomach issues and anemia  . Metolazone Other (See Comments)    / Pt denies   Medications: See med rec.  Review of Systems: No fevers, chills, night sweats, weight loss, chest pain, or shortness of breath.   Objective:    General: Well Developed, well nourished, and in no acute distress.  Neuro: Alert and oriented x3, extra-ocular muscles intact, sensation grossly intact.  HEENT: Normocephalic, atraumatic, pupils equal round reactive to light, neck supple, no masses, no lymphadenopathy,  thyroid nonpalpable.  Skin: Warm and dry, no rashes. Cardiac: Regular rate and rhythm, no murmurs rubs or gallops, no lower extremity edema.  Respiratory: Clear to auscultation bilaterally. Not using accessory muscles, speaking in full sentences. Right knee: Visible and palpable effusion with fluid wave ROM normal in flexion and extension and lower leg rotation. Ligaments with solid consistent endpoints including ACL, PCL, LCL, MCL. Negative Mcmurray's and provocative meniscal tests. Non painful patellar compression. Patellar and quadriceps tendons unremarkable. Hamstring and quadriceps strength is  normal.  Procedure: Real-time Ultrasound Guided aspiration of right knee Device: GE Logiq E  Verbal informed consent obtained.  Time-out conducted.  Noted no overlying erythema, induration, or other signs of local infection.  Skin prepped in a sterile fashion.  Local anesthesia: Topical Ethyl chloride.  With sterile technique and under real time ultrasound guidance: Using an 18-gauge needle I aspirated 30 mL of clear, straw-colored fluid. Completed without difficulty  Pain immediately resolved suggesting accurate placement of the medication.  Advised to call if fevers/chills, erythema, induration, drainage, or persistent bleeding.  Images permanently stored and available for review in the ultrasound unit.  Impression: Technically successful ultrasound guided injection.  Impression and Recommendations:    Primary osteoarthritis of right knee Repeat arthrocentesis, previous was in May.   No injection today. ___________________________________________ Gwen Her. Dianah Field, M.D., ABFM., CAQSM. Primary Care and [place] Instructor of [place] of [facility] of Medicine

## 2017-08-12 NOTE — Assessment & Plan Note (Signed)
Repeat arthrocentesis, previous was in May.   No injection today.

## 2017-08-13 DIAGNOSIS — A4102 Sepsis due to Methicillin resistant Staphylococcus aureus: Secondary | ICD-10-CM | POA: Diagnosis not present

## 2017-08-13 DIAGNOSIS — N390 Urinary tract infection, site not specified: Secondary | ICD-10-CM | POA: Diagnosis not present

## 2017-08-13 DIAGNOSIS — Z960 Presence of urogenital implants: Secondary | ICD-10-CM | POA: Diagnosis not present

## 2017-08-13 DIAGNOSIS — C679 Malignant neoplasm of bladder, unspecified: Secondary | ICD-10-CM | POA: Diagnosis not present

## 2017-08-14 DIAGNOSIS — G4733 Obstructive sleep apnea (adult) (pediatric): Secondary | ICD-10-CM | POA: Diagnosis not present

## 2017-08-14 DIAGNOSIS — I5032 Chronic diastolic (congestive) heart failure: Secondary | ICD-10-CM | POA: Diagnosis not present

## 2017-08-14 DIAGNOSIS — I2510 Atherosclerotic heart disease of native coronary artery without angina pectoris: Secondary | ICD-10-CM | POA: Diagnosis not present

## 2017-08-14 DIAGNOSIS — A4102 Sepsis due to Methicillin resistant Staphylococcus aureus: Secondary | ICD-10-CM | POA: Diagnosis not present

## 2017-08-14 DIAGNOSIS — H9190 Unspecified hearing loss, unspecified ear: Secondary | ICD-10-CM | POA: Diagnosis not present

## 2017-08-14 DIAGNOSIS — K219 Gastro-esophageal reflux disease without esophagitis: Secondary | ICD-10-CM | POA: Diagnosis not present

## 2017-08-14 DIAGNOSIS — Z452 Encounter for adjustment and management of vascular access device: Secondary | ICD-10-CM | POA: Diagnosis not present

## 2017-08-14 DIAGNOSIS — I110 Hypertensive heart disease with heart failure: Secondary | ICD-10-CM | POA: Diagnosis not present

## 2017-08-14 DIAGNOSIS — G609 Hereditary and idiopathic neuropathy, unspecified: Secondary | ICD-10-CM | POA: Diagnosis not present

## 2017-08-16 DIAGNOSIS — A4102 Sepsis due to Methicillin resistant Staphylococcus aureus: Secondary | ICD-10-CM | POA: Diagnosis not present

## 2017-08-16 DIAGNOSIS — N3090 Cystitis, unspecified without hematuria: Secondary | ICD-10-CM | POA: Diagnosis not present

## 2017-08-18 DIAGNOSIS — Z452 Encounter for adjustment and management of vascular access device: Secondary | ICD-10-CM | POA: Diagnosis not present

## 2017-08-18 DIAGNOSIS — G609 Hereditary and idiopathic neuropathy, unspecified: Secondary | ICD-10-CM | POA: Diagnosis not present

## 2017-08-18 DIAGNOSIS — I5032 Chronic diastolic (congestive) heart failure: Secondary | ICD-10-CM | POA: Diagnosis not present

## 2017-08-18 DIAGNOSIS — H9190 Unspecified hearing loss, unspecified ear: Secondary | ICD-10-CM | POA: Diagnosis not present

## 2017-08-18 DIAGNOSIS — I110 Hypertensive heart disease with heart failure: Secondary | ICD-10-CM | POA: Diagnosis not present

## 2017-08-18 DIAGNOSIS — G4733 Obstructive sleep apnea (adult) (pediatric): Secondary | ICD-10-CM | POA: Diagnosis not present

## 2017-08-18 DIAGNOSIS — A4102 Sepsis due to Methicillin resistant Staphylococcus aureus: Secondary | ICD-10-CM | POA: Diagnosis not present

## 2017-08-18 DIAGNOSIS — K219 Gastro-esophageal reflux disease without esophagitis: Secondary | ICD-10-CM | POA: Diagnosis not present

## 2017-08-18 DIAGNOSIS — I2510 Atherosclerotic heart disease of native coronary artery without angina pectoris: Secondary | ICD-10-CM | POA: Diagnosis not present

## 2017-08-21 DIAGNOSIS — H9190 Unspecified hearing loss, unspecified ear: Secondary | ICD-10-CM | POA: Diagnosis not present

## 2017-08-21 DIAGNOSIS — G4733 Obstructive sleep apnea (adult) (pediatric): Secondary | ICD-10-CM | POA: Diagnosis not present

## 2017-08-21 DIAGNOSIS — I110 Hypertensive heart disease with heart failure: Secondary | ICD-10-CM | POA: Diagnosis not present

## 2017-08-21 DIAGNOSIS — Z452 Encounter for adjustment and management of vascular access device: Secondary | ICD-10-CM | POA: Diagnosis not present

## 2017-08-21 DIAGNOSIS — G609 Hereditary and idiopathic neuropathy, unspecified: Secondary | ICD-10-CM | POA: Diagnosis not present

## 2017-08-21 DIAGNOSIS — K219 Gastro-esophageal reflux disease without esophagitis: Secondary | ICD-10-CM | POA: Diagnosis not present

## 2017-08-21 DIAGNOSIS — A4102 Sepsis due to Methicillin resistant Staphylococcus aureus: Secondary | ICD-10-CM | POA: Diagnosis not present

## 2017-08-21 DIAGNOSIS — I2510 Atherosclerotic heart disease of native coronary artery without angina pectoris: Secondary | ICD-10-CM | POA: Diagnosis not present

## 2017-08-21 DIAGNOSIS — I5032 Chronic diastolic (congestive) heart failure: Secondary | ICD-10-CM | POA: Diagnosis not present

## 2017-09-01 ENCOUNTER — Other Ambulatory Visit: Payer: Self-pay | Admitting: Sports Medicine

## 2017-09-01 DIAGNOSIS — I10 Essential (primary) hypertension: Secondary | ICD-10-CM

## 2017-09-18 ENCOUNTER — Ambulatory Visit (INDEPENDENT_AMBULATORY_CARE_PROVIDER_SITE_OTHER): Payer: Medicare HMO | Admitting: Sports Medicine

## 2017-09-18 ENCOUNTER — Encounter: Payer: Self-pay | Admitting: Sports Medicine

## 2017-09-18 DIAGNOSIS — M1711 Unilateral primary osteoarthritis, right knee: Secondary | ICD-10-CM

## 2017-09-18 DIAGNOSIS — N390 Urinary tract infection, site not specified: Secondary | ICD-10-CM | POA: Diagnosis not present

## 2017-09-18 DIAGNOSIS — I10 Essential (primary) hypertension: Secondary | ICD-10-CM | POA: Diagnosis not present

## 2017-09-18 DIAGNOSIS — C679 Malignant neoplasm of bladder, unspecified: Secondary | ICD-10-CM | POA: Diagnosis not present

## 2017-09-18 DIAGNOSIS — D649 Anemia, unspecified: Secondary | ICD-10-CM | POA: Diagnosis not present

## 2017-09-18 DIAGNOSIS — R7989 Other specified abnormal findings of blood chemistry: Secondary | ICD-10-CM | POA: Diagnosis not present

## 2017-09-18 NOTE — Assessment & Plan Note (Addendum)
Currently co-managed with urology, has prostate cancer, bladder cancer. He did have a non-vancomycin resistant enterococcal bacteremia after ureteral stents, overall doing well. Having increasing hematuria. Increasing fatigue, checking urinalysis with urine culture, CBC, coags.  Persistent and significant bleeding, he is going to need a blood transfusion, please set him up for 2 units of packed red blood cells with his oncology home at [facility].  He will also need to get back in with his urologist ASAP.  Urine culture is growing out Proteus mirabilis, highly sensitive to levofloxacin, calling this in.

## 2017-09-18 NOTE — Progress Notes (Addendum)
Subjective:    CC: Right knee swelling  HPI: This is a pleasant 84 year old male, he has a history of bladder and prostate cancer, known knee osteoarthritis with recurrence of knee swelling and pain.  We have done our thoracenteses approximately monthly, the last time we actually injected medication into the knee was about 3 months ago.  Symptoms are moderate, worsening.  In addition he is been having a large amount of hematuria.  Does not have a follow-up yet with uro-oncology.  Increasing fatigue.  I reviewed the past medical history, family history, social history, surgical history, and allergies today and no changes were needed.  Please see the problem list section below in epic for further details.  Past Medical History: No past medical history on file. Past Surgical History: Past Surgical History:  Procedure Laterality Date  . APPENDECTOMY  05/03/2012  . CATARACT EXTRACTION Left 06/13/2011   eye  . CATARACT EXTRACTION Right 08/15/2011   eye  . HERNIA REPAIR  2009  . LUMBAR LAMINECTOMY/ DECOMPRESSION WITH MET-RX  2008  . mandibular tori  [phone]  . MAXIMUM ACCESS (MAS)POSTERIOR LUMBAR INTERBODY FUSION (PLIF) 1 LEVEL  01/12/2010  . [place]  . PROSTATECTOMY  1992  . RADICAL ORCHIECTOMY  2004  . removal of nasal polyps and sinus tissue  1997  . SHOULDER ARTHROSCOPY WITH SUBACROMIAL DECOMPRESSION, ROTATOR CUFF REPAIR AND BICEP TENDON REPAIR  1998  . thumb joint repair Left 1999  . thumb joint repair Right 2004  . TONSILECTOMY, ADENOIDECTOMY, BILATERAL MYRINGOTOMY AND TUBES  1940  . triple by-pass heart surgery  2006  . UVULOPALATOPHARYNGOPLASTY  1996   Social History: Social History   Socioeconomic History  . Marital status: Married    Spouse name: Not on file  . Number of children: Not on file  . Years of education: Not on file  . Highest education level: Not on file  Occupational History  . Not on file  Social Needs  . Financial  resource strain: Not on file  . Food insecurity:    Worry: Not on file    Inability: Not on file  . Transportation needs:    Medical: Not on file    Non-medical: Not on file  Tobacco Use  . Smoking status: Former Research scientist (life sciences)  . Smokeless tobacco: Never Used  Substance and Sexual Activity  . Alcohol use: No  . Drug use: No  . Sexual activity: Not on file  Lifestyle  . Physical activity:    Days per week: Not on file    Minutes per session: Not on file  . Stress: Not on file  Relationships  . Social connections:    Talks on phone: Not on file    Gets together: Not on file    Attends religious service: Not on file    Active member of club or organization: Not on file    Attends meetings of clubs or organizations: Not on file    Relationship status: Not on file  Other Topics Concern  . Not on file  Social History Narrative  . Not on file   Family History: No family history on file. Allergies: Allergies  Allergen Reactions  . Aspirin Other (See Comments)    Unknown/Pt states unable to take due to stomach issues and anemia  . Metolazone Other (See Comments)    / Pt denies   Medications: See med rec.  Review of Systems: No fevers, chills, night sweats, weight loss, chest pain, or shortness  of breath.   Objective:    General: Well Developed, well nourished, and in no acute distress.  Neuro: Alert and oriented x3, extra-ocular muscles intact, sensation grossly intact.  HEENT: Normocephalic, atraumatic, pupils equal round reactive to light, neck supple, no masses, no lymphadenopathy, thyroid nonpalpable.  Skin: Warm and dry, no rashes. Cardiac: Regular rate and rhythm, no murmurs rubs or gallops, no lower extremity edema.  Respiratory: Clear to auscultation bilaterally. Not using accessory muscles, speaking in full sentences. Right knee: Swollen with a palpable fluid wave, effusion, and tenderness at the medial joint line ROM normal in flexion and extension and lower leg  rotation. Ligaments with solid consistent endpoints including ACL, PCL, LCL, MCL. Negative Mcmurray's and provocative meniscal tests. Non painful patellar compression. Patellar and quadriceps tendons unremarkable. Hamstring and quadriceps strength is normal.  Procedure: Real-time Ultrasound guided aspiration/injection of right knee Device: GE Logiq E  Verbal informed consent obtained.  Time-out conducted.  Noted no overlying erythema, induration, or other signs of local infection.  Skin prepped in a sterile fashion.  Local anesthesia: Topical Ethyl chloride.  With sterile technique and under real time ultrasound guidance: Using 18-gauge needle aspirated 35 mL of clear, straw-colored fluid, syringe switched and 1 cc kenalog 40, 2 cc lidocaine, 2 cc bupivacaine injected easily Completed without difficulty  Pain immediately resolved suggesting accurate placement of the medication.  Advised to call if fevers/chills, erythema, induration, drainage, or persistent bleeding.  Images permanently stored and available for review in the ultrasound unit.  Impression: Technically successful ultrasound guided injection.  Impression and Recommendations:    Primary osteoarthritis of right knee Aspiration and injection of right knee, previous injection was 3 months ago, we have done aspirations approximately monthly.  Bladder cancer [facility]) Currently co-managed with urology, has prostate cancer, bladder cancer. He did have a non-vancomycin resistant enterococcal bacteremia after ureteral stents, overall doing well. Having increasing hematuria. Increasing fatigue, checking urinalysis with urine culture, CBC, coags.  Persistent and significant bleeding, he is going to need a blood transfusion, please set him up for 2 units of packed red blood cells with his oncology home at [facility].  He will also need to get back in with his urologist ASAP.  Urine culture is growing out Proteus mirabilis, highly sensitive  to levofloxacin, calling this in.  Normocytic anemia No loss anemia. I think he does need 2 units of packed red blood cells transfusion, hemoglobin down into the sevens. He will get back in with his urologist and his oncologist. ___________________________________________ Gwen Her. Dianah Field, M.D., ABFM., CAQSM. Primary Care and [place] Instructor of [place] [facility]

## 2017-09-18 NOTE — Assessment & Plan Note (Signed)
Aspiration and injection of right knee, previous injection was 3 months ago, we have done aspirations approximately monthly.

## 2017-09-19 NOTE — Assessment & Plan Note (Signed)
No loss anemia. I think he does need 2 units of packed red blood cells transfusion, hemoglobin down into the sevens. He will get back in with his urologist and his oncologist.

## 2017-09-20 LAB — URINALYSIS W MICROSCOPIC + REFLEX CULTURE
Bilirubin Urine: NEGATIVE
Glucose, UA: NEGATIVE
Hyaline Cast: NONE SEEN /LPF
Ketones, ur: NEGATIVE
Nitrites, Initial: NEGATIVE
Specific Gravity, Urine: 1.008 (ref 1.001–1.03)
pH: 6 (ref 5.0–8.0)

## 2017-09-20 LAB — CBC
HCT: 24 % — ABNORMAL LOW (ref 38.5–50.0)
Hemoglobin: 7.8 g/dL — ABNORMAL LOW (ref 13.2–17.1)
MCH: 28.6 pg (ref 27.0–33.0)
MCHC: 32.5 g/dL (ref 32.0–36.0)
MCV: 87.9 fL (ref 80.0–100.0)
MPV: 10.6 fL (ref 7.5–12.5)
Platelets: 258 10*3/uL (ref 140–400)
RBC: 2.73 10*6/uL — ABNORMAL LOW (ref 4.20–5.80)
RDW: 13.3 % (ref 11.0–15.0)
WBC: 7.9 10*3/uL (ref 3.8–10.8)

## 2017-09-20 LAB — URINE CULTURE
MICRO NUMBER:: 91008946
SPECIMEN QUALITY:: ADEQUATE

## 2017-09-20 LAB — PROTIME-INR
INR: 1.1
Prothrombin Time: 12.1 s — ABNORMAL HIGH (ref 9.0–11.5)

## 2017-09-20 LAB — CULTURE INDICATED

## 2017-09-20 LAB — TSH: TSH: 1.47 m[IU]/L (ref 0.40–4.50)

## 2017-09-20 LAB — APTT: aPTT: 38 s — ABNORMAL HIGH (ref 22–34)

## 2017-09-22 DIAGNOSIS — R319 Hematuria, unspecified: Secondary | ICD-10-CM | POA: Diagnosis not present

## 2017-09-22 DIAGNOSIS — E785 Hyperlipidemia, unspecified: Secondary | ICD-10-CM | POA: Diagnosis not present

## 2017-09-22 DIAGNOSIS — N3001 Acute cystitis with hematuria: Secondary | ICD-10-CM | POA: Diagnosis not present

## 2017-09-22 DIAGNOSIS — K219 Gastro-esophageal reflux disease without esophagitis: Secondary | ICD-10-CM | POA: Diagnosis not present

## 2017-09-22 DIAGNOSIS — N390 Urinary tract infection, site not specified: Secondary | ICD-10-CM | POA: Diagnosis not present

## 2017-09-22 DIAGNOSIS — R5383 Other fatigue: Secondary | ICD-10-CM | POA: Diagnosis not present

## 2017-09-22 DIAGNOSIS — I10 Essential (primary) hypertension: Secondary | ICD-10-CM | POA: Diagnosis not present

## 2017-09-22 DIAGNOSIS — D649 Anemia, unspecified: Secondary | ICD-10-CM | POA: Diagnosis not present

## 2017-09-22 DIAGNOSIS — Z8546 Personal history of malignant neoplasm of prostate: Secondary | ICD-10-CM | POA: Diagnosis not present

## 2017-09-22 DIAGNOSIS — I252 Old myocardial infarction: Secondary | ICD-10-CM | POA: Diagnosis not present

## 2017-09-22 DIAGNOSIS — C679 Malignant neoplasm of bladder, unspecified: Secondary | ICD-10-CM | POA: Diagnosis not present

## 2017-09-22 MED ORDER — LEVOFLOXACIN 750 MG PO TABS: 750.0000 mg | ORAL_TABLET | Freq: Every day | ORAL | 0 refills | Status: DC

## 2017-09-22 NOTE — Addendum Note (Signed)
Addended by: Silverio Decamp on: 09/22/2017 09:50 AM   Modules accepted: Orders

## 2017-09-28 ENCOUNTER — Other Ambulatory Visit: Payer: Self-pay | Admitting: Sports Medicine

## 2017-10-13 ENCOUNTER — Ambulatory Visit (INDEPENDENT_AMBULATORY_CARE_PROVIDER_SITE_OTHER): Payer: Medicare HMO | Admitting: Sports Medicine

## 2017-10-13 ENCOUNTER — Encounter: Payer: Self-pay | Admitting: Sports Medicine

## 2017-10-13 DIAGNOSIS — M1711 Unilateral primary osteoarthritis, right knee: Secondary | ICD-10-CM | POA: Diagnosis not present

## 2017-10-13 DIAGNOSIS — R1024 Suprapubic pain: Secondary | ICD-10-CM

## 2017-10-13 DIAGNOSIS — R102 Pelvic and perineal pain: Secondary | ICD-10-CM | POA: Diagnosis not present

## 2017-10-13 NOTE — Assessment & Plan Note (Signed)
35 cc arthrocentesis. Return as needed.

## 2017-10-13 NOTE — Assessment & Plan Note (Addendum)
Recurrence of suprapubic abdominal pain. Since his bladder cancer, he has had several urinary tract infections. Adding urinalysis and urine culture.  Urinalysis is positive for leukocytes, also for yeast.  Adding an additional 7-day course of antibiotics and fluconazole, antibiotic choice may change when culture results available.  Levofloxacin as the antibiotic for 1 week and fluconazole 200 mg daily for 2 weeks.

## 2017-10-13 NOTE — Progress Notes (Addendum)
Subjective:    CC: Right knee swelling  HPI: Jeffery Hart has known knee osteoarthritis, we do occasional our thoracenteses, therapeutic.  No injection.  He is having a recurrence of swelling and desires arthrocentesis today, symptoms are moderate, worsening.  Localized without radiation.  No trauma, no mechanical symptoms.  Abdominal pain: Suprapubic, history of multiple UTIs in the past, history of bladder cancer.  No fevers, chills, GI symptoms.  I reviewed the past medical history, family history, social history, surgical history, and allergies today and no changes were needed.  Please see the problem list section below in epic for further details.  Past Medical History: No past medical history on file. Past Surgical History: Past Surgical History:  Procedure Laterality Date  . APPENDECTOMY  05/03/2012  . CATARACT EXTRACTION Left 06/13/2011   eye  . CATARACT EXTRACTION Right 08/15/2011   eye  . HERNIA REPAIR  2009  . LUMBAR LAMINECTOMY/ DECOMPRESSION WITH MET-RX  2008  . mandibular tori  [phone]  . MAXIMUM ACCESS (MAS)POSTERIOR LUMBAR INTERBODY FUSION (PLIF) 1 LEVEL  01/12/2010  . [place]  . PROSTATECTOMY  1992  . RADICAL ORCHIECTOMY  2004  . removal of nasal polyps and sinus tissue  1997  . SHOULDER ARTHROSCOPY WITH SUBACROMIAL DECOMPRESSION, ROTATOR CUFF REPAIR AND BICEP TENDON REPAIR  1998  . thumb joint repair Left 1999  . thumb joint repair Right 2004  . TONSILECTOMY, ADENOIDECTOMY, BILATERAL MYRINGOTOMY AND TUBES  1940  . triple by-pass heart surgery  2006  . UVULOPALATOPHARYNGOPLASTY  1996   Social History: Social History   Socioeconomic History  . Marital status: Married    Spouse name: Not on file  . Number of children: Not on file  . Years of education: Not on file  . Highest education level: Not on file  Occupational History  . Not on file  Social Needs  . Financial resource strain: Not on file  . Food insecurity:    Worry:  Not on file    Inability: Not on file  . Transportation needs:    Medical: Not on file    Non-medical: Not on file  Tobacco Use  . Smoking status: Former Research scientist (life sciences)  . Smokeless tobacco: Never Used  Substance and Sexual Activity  . Alcohol use: No  . Drug use: No  . Sexual activity: Not on file  Lifestyle  . Physical activity:    Days per week: Not on file    Minutes per session: Not on file  . Stress: Not on file  Relationships  . Social connections:    Talks on phone: Not on file    Gets together: Not on file    Attends religious service: Not on file    Active member of club or organization: Not on file    Attends meetings of clubs or organizations: Not on file    Relationship status: Not on file  Other Topics Concern  . Not on file  Social History Narrative  . Not on file   Family History: No family history on file. Allergies: Allergies  Allergen Reactions  . Aspirin Other (See Comments)    Unknown/Pt states unable to take due to stomach issues and anemia  . Metolazone Other (See Comments)    / Pt denies   Medications: See med rec.  Review of Systems: No fevers, chills, night sweats, weight loss, chest pain, or shortness of breath.   Objective:    General: Well Developed, well nourished, and in no  acute distress.  Neuro: Alert and oriented x3, extra-ocular muscles intact, sensation grossly intact.  HEENT: Normocephalic, atraumatic, pupils equal round reactive to light, neck supple, no masses, no lymphadenopathy, thyroid nonpalpable.  Skin: Warm and dry, no rashes. Cardiac: Regular rate and rhythm, no murmurs rubs or gallops, no lower extremity edema.  Respiratory: Clear to auscultation bilaterally. Not using accessory muscles, speaking in full sentences. Abdomen: Soft, tender in the superpubic region but no rales, nondistended, midline diastases recti, normal bowel sounds, no palpable masses, no guarding, rigidity, rebound tenderness. Right knee: Visibly swollen,  palpable fluid wave and effusion. ROM normal in flexion and extension and lower leg rotation. Ligaments with solid consistent endpoints including ACL, PCL, LCL, MCL. Negative Mcmurray's and provocative meniscal tests. Non painful patellar compression. Patellar and quadriceps tendons unremarkable. Hamstring and quadriceps strength is normal.  Procedure: Real-time Ultrasound Guided aspiration of right knee Device: GE Logiq E  Verbal informed consent obtained.  Time-out conducted.  Noted no overlying erythema, induration, or other signs of local infection.  Skin prepped in a sterile fashion.  Local anesthesia: Topical Ethyl chloride.  With sterile technique and under real time ultrasound guidance: Using a an 18-gauge needle advanced into the suprapatellar recess and aspirated 35 cc of clear, straw-colored fluid, syringe withdrawn. Completed without difficulty  Pain immediately resolved suggesting accurate placement of the medication.  Advised to call if fevers/chills, erythema, induration, drainage, or persistent bleeding.  Images permanently stored and available for review in the ultrasound unit.  Impression: Technically successful ultrasound guided injection.  Impression and Recommendations:    Primary osteoarthritis of right knee 35 cc arthrocentesis. Return as needed.  Abdominal pain, suprapubic Recurrence of suprapubic abdominal pain. Since his bladder cancer, he has had several urinary tract infections. Adding urinalysis and urine culture.  Urinalysis is positive for leukocytes, also for yeast.  Adding an additional 7-day course of antibiotics and fluconazole, antibiotic choice may change when culture results available.  Levofloxacin as the antibiotic for 1 week and fluconazole 200 mg daily for 2 weeks. ___________________________________________ Gwen Her. Dianah Field, M.D., ABFM., CAQSM. Primary Care and [place]  Instructor of [place] [facility]

## 2017-10-14 MED ORDER — FLUCONAZOLE 200 MG PO TABS: 200.0000 mg | ORAL_TABLET | Freq: Every day | ORAL | 0 refills | Status: DC

## 2017-10-14 MED ORDER — LEVOFLOXACIN 750 MG PO TABS: 750.0000 mg | ORAL_TABLET | Freq: Every day | ORAL | 0 refills | Status: DC

## 2017-10-14 NOTE — Addendum Note (Signed)
Addended by: Silverio Decamp on: 10/14/2017 09:11 AM   Modules accepted: Orders

## 2017-10-15 LAB — URINE CULTURE
MICRO NUMBER:: 91113540
SPECIMEN QUALITY:: ADEQUATE

## 2017-10-15 LAB — URINALYSIS W MICROSCOPIC + REFLEX CULTURE
Bacteria, UA: NONE SEEN /HPF
Bilirubin Urine: NEGATIVE
Glucose, UA: NEGATIVE
Ketones, ur: NEGATIVE
Nitrites, Initial: NEGATIVE
Specific Gravity, Urine: 1.009 (ref 1.001–1.03)
Squamous Epithelial / HPF: NONE SEEN /HPF (ref <=5)
pH: 5.5 (ref 5.0–8.0)

## 2017-10-15 LAB — CULTURE INDICATED

## 2017-10-21 DIAGNOSIS — C679 Malignant neoplasm of bladder, unspecified: Secondary | ICD-10-CM | POA: Diagnosis not present

## 2017-10-21 DIAGNOSIS — D509 Iron deficiency anemia, unspecified: Secondary | ICD-10-CM | POA: Diagnosis not present

## 2017-10-21 DIAGNOSIS — C61 Malignant neoplasm of prostate: Secondary | ICD-10-CM | POA: Diagnosis not present

## 2017-10-22 DIAGNOSIS — D649 Anemia, unspecified: Secondary | ICD-10-CM | POA: Diagnosis not present

## 2017-10-23 DIAGNOSIS — D649 Anemia, unspecified: Secondary | ICD-10-CM | POA: Diagnosis not present

## 2017-10-27 ENCOUNTER — Ambulatory Visit (INDEPENDENT_AMBULATORY_CARE_PROVIDER_SITE_OTHER): Payer: Medicare HMO | Admitting: Sports Medicine

## 2017-10-27 DIAGNOSIS — Z23 Encounter for immunization: Secondary | ICD-10-CM

## 2017-10-31 ENCOUNTER — Other Ambulatory Visit: Payer: Self-pay | Admitting: Sports Medicine

## 2017-11-02 ENCOUNTER — Other Ambulatory Visit: Payer: Self-pay | Admitting: Sports Medicine

## 2017-11-05 DIAGNOSIS — C679 Malignant neoplasm of bladder, unspecified: Secondary | ICD-10-CM | POA: Diagnosis not present

## 2017-11-10 DIAGNOSIS — C679 Malignant neoplasm of bladder, unspecified: Secondary | ICD-10-CM | POA: Diagnosis not present

## 2017-11-23 ENCOUNTER — Other Ambulatory Visit: Payer: Self-pay | Admitting: Sports Medicine

## 2017-11-23 DIAGNOSIS — I10 Essential (primary) hypertension: Secondary | ICD-10-CM

## 2017-11-27 ENCOUNTER — Telehealth: Payer: Self-pay

## 2017-11-27 NOTE — Telephone Encounter (Signed)
Pt calls stating he has not been feeling well lately. He is very fatigued and tired. Reports some bleeding from stent in ureter. Reports bleeding is just small little drops. No fever. Pt feels his BP has dropped- his wife checked it on phone with me and it was 140/68.   Pt scheduled to see Dr Georgina Snell tomorrow at 11:30. Advised if any new or worsening of SX, pt need to be seen at urgent care or ER

## 2017-11-28 ENCOUNTER — Encounter: Payer: Self-pay | Admitting: Family Medicine

## 2017-11-28 ENCOUNTER — Ambulatory Visit (INDEPENDENT_AMBULATORY_CARE_PROVIDER_SITE_OTHER): Payer: Medicare HMO | Admitting: Family Medicine

## 2017-11-28 DIAGNOSIS — G4733 Obstructive sleep apnea (adult) (pediatric): Secondary | ICD-10-CM | POA: Diagnosis not present

## 2017-11-28 DIAGNOSIS — R7989 Other specified abnormal findings of blood chemistry: Secondary | ICD-10-CM | POA: Diagnosis not present

## 2017-11-28 DIAGNOSIS — M1711 Unilateral primary osteoarthritis, right knee: Secondary | ICD-10-CM

## 2017-11-28 DIAGNOSIS — M25461 Effusion, right knee: Secondary | ICD-10-CM

## 2017-11-28 DIAGNOSIS — D500 Iron deficiency anemia secondary to blood loss (chronic): Secondary | ICD-10-CM | POA: Diagnosis not present

## 2017-11-28 DIAGNOSIS — D649 Anemia, unspecified: Secondary | ICD-10-CM | POA: Diagnosis not present

## 2017-11-28 DIAGNOSIS — C679 Malignant neoplasm of bladder, unspecified: Secondary | ICD-10-CM | POA: Diagnosis not present

## 2017-11-28 NOTE — Patient Instructions (Signed)
Thank you for coming in today. Call or go to the ER if you develop a large red swollen joint with extreme pain or oozing puss.  You should hear about blood test results soon.  Recheck as needed.   Call or go to the emergency room if you get worse, have trouble breathing, have chest pains, or palpitations.

## 2017-11-28 NOTE — Progress Notes (Signed)
Jeffery Hart is a 84 y.o. male who presents to [facility] today for fatigue.  K[place]ell has a history of bladder cancer currently receiving treatment.  He has frequent hematuria and chronically low hemoglobin.  He is received blood transfusion several times recently.  His last transfusion was about a month ago after his hemoglobin dipped below 8.  He is feeling fatigued again and suspects that his hemoglobin is less than 8 again.  He denies chest pain palpitations or shortness of breath.  Additionally he notes right knee pain and swelling.  He has a history of significant right knee DJD and has been receiving aspiration and injections recently which have helped.  His last aspiration injection was approximately 6 weeks ago.  The knee began swelling to become painful again recently.    ROS as above:  Exam:  Heart rate 80 bpm.  Blood pressure 100/60.  Respiratory rate 12.   Gen: Well NAD HEENT: EOMI,  MMM Lungs: Normal work of breathing. CTABL Heart: RRR no MRG Abd: NABS, Soft. Nondistended, Nontender Exts: Brisk capillary refill, warm and well perfused.  Right knee: Moderate effusion no erythema otherwise normal-appearing Range of motion 0-100 degrees with crepitations. Mildly tender to palpation. Stable ligamentous exam. Mild antalgic gait.  Right knee aspiration and injection: Consent obtained and timeout performed. Risks and benefits of the procedure were reviewed. Superior lateral patellar space identified and cleaned with rubbing alcohol.  1 mL of lidocaine injected subcutaneously to achieve good anesthesia. Skin was again sterilized with alcohol and chlorhexidine. 18-gauge needle was used to access the joint capsule at the superior lateral patellar space. 35 mL of clear yellow fluid aspirated. Syringe exchanged and 40 mg of Kenalog and 3 mL of Marcaine  injected. Patient tolerated the procedure well and had immediate pain relief.   Assessment and Plan: 84 y.o. male with  Fatigue and anemia.  I am strongly suspicious that patient's hemoglobin is now again less than 8 and he will likely benefit from blood transfusion.  Plan to check CBC as well as metabolic panel.  Additionally will check iron stores with ferritin TIBC.  He may benefit from IV iron infusion in the future to hopefully stave off the next transfusion.  If hemoglobin less than 8 recommend patient go to ED for transfusion.  Right knee effusion and pain: Exacerbation of DJD likely.  Aspirated and injected today.  Plan to check crystal analysis of fluid aspirate to evaluate for gout which is certainly a possibility that may be contributing to his knee pain  Follow back up with PCP in the near future.   Orders Placed This Encounter  Procedures  . CBC  . COMPLETE METABOLIC PANEL WITH GFR  . Fe+TIBC+Fer  . Synovial cell count + diff, w/ crystals   No orders of the defined types were placed in this encounter.    Historical information moved to improve visibility of documentation.  Past Medical History:  Diagnosis Date  . Bladder cancer ([place]) 10/23/2015  . CAD (coronary artery disease) status post bypass grafting 01/29/2013  . Clostridium difficile diarrhea 07/18/2016  . Essential hypertension, benign 01/29/2013  . History of Clostridioides difficile infection 07/18/2016  . History of prostate cancer 01/29/2013  . Hyperlipidemia 01/29/2013  . Primary osteoarthritis of right knee 02/09/2013   [place] received   . Right lower lobe pneumonia ([place]) 07/03/2017  . Rotator cuff tear arthropathy of left shoulder 04/09/2013  . Skin cancer  11/15/2013   Past Surgical History:  Procedure Laterality Date  . APPENDECTOMY  05/03/2012  . CATARACT EXTRACTION Left 06/13/2011   eye  . CATARACT EXTRACTION Right 08/15/2011   eye  . HERNIA REPAIR  2009  . LUMBAR LAMINECTOMY/ DECOMPRESSION WITH MET-RX   2008  . mandibular tori  [phone]  . MAXIMUM ACCESS (MAS)POSTERIOR LUMBAR INTERBODY FUSION (PLIF) 1 LEVEL  01/12/2010  . [place]  . PROSTATECTOMY  1992  . RADICAL ORCHIECTOMY  2004  . removal of nasal polyps and sinus tissue  1997  . SHOULDER ARTHROSCOPY WITH SUBACROMIAL DECOMPRESSION, ROTATOR CUFF REPAIR AND BICEP TENDON REPAIR  1998  . thumb joint repair Left 1999  . thumb joint repair Right 2004  . TONSILECTOMY, ADENOIDECTOMY, BILATERAL MYRINGOTOMY AND TUBES  1940  . triple by-pass heart surgery  2006  . UVULOPALATOPHARYNGOPLASTY  1996   Social History   Tobacco Use  . Smoking status: Former Research scientist (life sciences)  . Smokeless tobacco: Never Used  Substance Use Topics  . Alcohol use: No   family history is not on file.  Medications: Current Outpatient Medications  Medication Sig Dispense Refill  . acyclovir (ZOVIRAX) 800 MG tablet TAKE 1 TABLET (800 MG TOTAL) BY MOUTH 3 (THREE) TIMES DAILY. 30 tablet 0  . AMBULATORY NON FORMULARY MEDICATION Knee-high, medium compression, graduated compression stockings. Apply to lower extremities. [email or web address], Zippered Compression Stockings, medium circ, long length 1 each 0  . carvedilol (COREG) 25 MG tablet TAKE 1 TABLET (25 MG TOTAL) BY MOUTH 2 (TWO) TIMES DAILY WITH A MEAL. 180 tablet 0  . cyclobenzaprine (FLEXERIL) 10 MG tablet Take 1 tablet (10 mg total) by mouth every 8 (eight) hours as needed for muscle spasms. 30 tablet 11  . doxepin (SINEQUAN) 25 MG capsule TAKE 1 CAPSULE (25 MG TOTAL) BY MOUTH AT BEDTIME. 90 capsule 1  . ferrous sulfate 325 (65 FE) MG EC tablet TAKE 1 TABLET (325 MG TOTAL) BY MOUTH 2 (TWO) TIMES DAILY WITH A MEAL. (Patient taking differently: Take 325 mg by mouth 3 (three) times daily. ) 180 tablet 3  . fluconazole (DIFLUCAN) 200 MG tablet Take 1 tablet (200 mg total) by mouth daily. 14 tablet 0  . fluorouracil (EFUDEX) 5 % cream Apply topically 2 (two) times daily.    Marland Kitchen gabapentin  (NEURONTIN) 300 MG capsule TAKE 1 CAPSULE (300 MG TOTAL) BY MOUTH 3 (THREE) TIMES DAILY. 270 capsule 3  . HYDROcodone-acetaminophen (NORCO) 7.5-325 MG tablet Take 1 tablet by mouth every 8 (eight) hours as needed. 90 tablet 0  . levofloxacin (LEVAQUIN) 750 MG tablet Take 1 tablet (750 mg total) by mouth daily. 7 tablet 0  . lisinopril (PRINIVIL,ZESTRIL) 40 MG tablet TAKE 1 TABLET (40 MG TOTAL) BY MOUTH DAILY. 90 tablet 0  . nitroGLYCERIN (NITROSTAT) 0.4 MG SL tablet Place 0.4 mg under the tongue as needed for chest pain.    Marland Kitchen omeprazole (PRILOSEC) 40 MG capsule Take 1 capsule (40 mg total) by mouth daily. Take in the morning and at dinnertime. 60 capsule 11  . simvastatin (ZOCOR) 40 MG tablet TAKE 1 TABLET (40 MG TOTAL) BY MOUTH DAILY AT 6 PM. 90 tablet 3  . sodium chloride 1 g tablet TAKE 1 TABLET (1 G TOTAL) BY MOUTH 3 (THREE) TIMES DAILY. 270 tablet 2  . tamsulosin (FLOMAX) 0.4 MG CAPS capsule      No current facility-administered medications for this visit.    Allergies  Allergen Reactions  . Aspirin Other (See Comments)  Unknown/Pt states unable to take due to stomach issues and anemia  . Metolazone Other (See Comments)    / Pt denies     Discussed warning signs or symptoms. Please see discharge instructions. Patient expresses understanding.

## 2017-11-29 LAB — COMPLETE METABOLIC PANEL WITH GFR
AG Ratio: 1.3 (calc) (ref 1.0–2.5)
ALT: 9 U/L (ref 9–46)
AST: 15 U/L (ref 10–35)
Albumin: 3.8 g/dL (ref 3.6–5.1)
Alkaline phosphatase (APISO): 73 U/L (ref 40–115)
BILIRUBIN TOTAL: 0.4 mg/dL (ref 0.2–1.2)
BUN/Creatinine Ratio: 22 (calc) (ref 6–22)
BUN: 53 mg/dL — AB (ref 7–25)
CHLORIDE: 103 mmol/L (ref 98–110)
CO2: 26 mmol/L (ref 20–32)
Calcium: 9 mg/dL (ref 8.6–10.3)
Creat: 2.44 mg/dL — ABNORMAL HIGH (ref 0.70–1.11)
GFR, EST AFRICAN AMERICAN: 26 mL/min/{1.73_m2} — AB (ref >=60)
GFR, Est Non African American: 23 mL/min/{1.73_m2} — ABNORMAL LOW (ref >=60)
GLUCOSE: 83 mg/dL (ref 65–99)
Globulin: 2.9 g/dL (calc) (ref 1.9–3.7)
POTASSIUM: 4.7 mmol/L (ref 3.5–5.3)
Sodium: 138 mmol/L (ref 135–146)
TOTAL PROTEIN: 6.7 g/dL (ref 6.1–8.1)

## 2017-11-29 LAB — CBC
HCT: 29.8 % — ABNORMAL LOW (ref 38.5–50.0)
Hemoglobin: 10 g/dL — ABNORMAL LOW (ref 13.2–17.1)
MCH: 28.5 pg (ref 27.0–33.0)
MCHC: 33.6 g/dL (ref 32.0–36.0)
MCV: 84.9 fL (ref 80.0–100.0)
MPV: 10.7 fL (ref 7.5–12.5)
PLATELETS: 239 10*3/uL (ref 140–400)
RBC: 3.51 10*6/uL — ABNORMAL LOW (ref 4.20–5.80)
RDW: 14.6 % (ref 11.0–15.0)
WBC: 7.2 10*3/uL (ref 3.8–10.8)

## 2017-11-29 LAB — IRON,TIBC AND FERRITIN PANEL
%SAT: 32 % (ref 20–48)
Ferritin: 717 ng/mL — ABNORMAL HIGH (ref 24–380)
Iron: 73 ug/dL (ref 50–180)
TIBC: 227 mcg/dL (calc) — ABNORMAL LOW (ref 250–425)

## 2017-12-01 NOTE — Addendum Note (Signed)
Addended by: Gregor Hams on: 12/01/2017 06:52 AM   Modules accepted: Orders

## 2017-12-08 LAB — COMPLETE METABOLIC PANEL WITH GFR

## 2017-12-08 LAB — CBC

## 2017-12-08 LAB — SYNOVIAL CELL COUNT + DIFF, W/ CRYSTALS
Basophils, %: 0 %
Eosinophils-Synovial: 0 % (ref 0–2)
Lymphocytes-Synovial Fld: 62 % (ref 0–74)
MONOCYTE/MACROPHAGE: 23 % (ref 0–69)
Neutrophil, Synovial: 15 % (ref 0–24)
SYNOVIOCYTES, %: 0 % (ref 0–15)
WBC, SYNOVIAL: 239 {cells}/uL — AB (ref <150)

## 2017-12-08 LAB — IRON,TIBC AND FERRITIN PANEL

## 2017-12-09 DIAGNOSIS — C61 Malignant neoplasm of prostate: Secondary | ICD-10-CM | POA: Diagnosis not present

## 2017-12-09 DIAGNOSIS — D509 Iron deficiency anemia, unspecified: Secondary | ICD-10-CM | POA: Diagnosis not present

## 2017-12-09 DIAGNOSIS — C679 Malignant neoplasm of bladder, unspecified: Secondary | ICD-10-CM | POA: Diagnosis not present

## 2017-12-11 ENCOUNTER — Encounter: Payer: Self-pay | Admitting: Sports Medicine

## 2017-12-11 ENCOUNTER — Ambulatory Visit (INDEPENDENT_AMBULATORY_CARE_PROVIDER_SITE_OTHER): Payer: Medicare HMO | Admitting: Sports Medicine

## 2017-12-11 DIAGNOSIS — N289 Disorder of kidney and ureter, unspecified: Secondary | ICD-10-CM | POA: Diagnosis not present

## 2017-12-11 DIAGNOSIS — D649 Anemia, unspecified: Secondary | ICD-10-CM | POA: Diagnosis not present

## 2017-12-11 DIAGNOSIS — M1711 Unilateral primary osteoarthritis, right knee: Secondary | ICD-10-CM | POA: Diagnosis not present

## 2017-12-11 DIAGNOSIS — I10 Essential (primary) hypertension: Secondary | ICD-10-CM | POA: Diagnosis not present

## 2017-12-11 MED ORDER — FERROUS SULFATE 325 (65 FE) MG PO TBEC: 325.0000 mg | DELAYED_RELEASE_TABLET | Freq: Three times a day (TID) | ORAL | 3 refills | Status: DC

## 2017-12-11 NOTE — Assessment & Plan Note (Signed)
Discontinue lisinopril, return to see me in 2 weeks. If persistently hypertensive we will add amlodipine instead.

## 2017-12-11 NOTE — Assessment & Plan Note (Signed)
History of prostate cancer, bilateral ureteral stents. Worsening renal function. We are not going to restart Lasix because of this, I am also going to discontinue his lisinopril. I need an ultrasound to ensure no residual or worsening hydronephrosis and stent obstruction. Rechecking labs today.

## 2017-12-11 NOTE — Assessment & Plan Note (Signed)
Partial response with aspiration and injection by Dr. Georgina Snell a month ago, calcium pyrophosphate crystals on arthrocentesis. Mild swelling today, moderate pain. Holding off on colchicine for now with current renal function. I would like to consider [place] radio frequency ablation, he is not a candidate at this point for knee arthroplasty.

## 2017-12-11 NOTE — Progress Notes (Signed)
Subjective:    CC: Several issues  HPI: Right knee pain: Osteoarthritis, injected about a month ago.  Persistent discomfort.  He did have calcium pyrophosphate crystals in aspirate, he does have significant renal insufficiency and is thus not on colchicine.  Renal insufficiency: Worsening since the last visit, he does have a history of bladder cancer, he does have bilateral ureteral stents.  Currently on lisinopril, has been off of his furosemide recently.  Anemia: Normocytic, on iron replacement.  Needs a refill.  Hypertension: Blood pressure is elevated today, he is in a bit of pain, no headaches, visual changes, chest pain.  I reviewed the past medical history, family history, social history, surgical history, and allergies today and no changes were needed.  Please see the problem list section below in epic for further details.  Past Medical History: Past Medical History:  Diagnosis Date  . Bladder cancer ([place]) 10/23/2015  . CAD (coronary artery disease) status post bypass grafting 01/29/2013  . Clostridium difficile diarrhea 07/18/2016  . Essential hypertension, benign 01/29/2013  . History of Clostridioides difficile infection 07/18/2016  . History of prostate cancer 01/29/2013  . Hyperlipidemia 01/29/2013  . Primary osteoarthritis of right knee 02/09/2013   Supartz received   . Right lower lobe pneumonia ([place]) 07/03/2017  . Rotator cuff tear arthropathy of left shoulder 04/09/2013  . Skin cancer 11/15/2013   Past Surgical History: Past Surgical History:  Procedure Laterality Date  . APPENDECTOMY  05/03/2012  . CATARACT EXTRACTION Left 06/13/2011   eye  . CATARACT EXTRACTION Right 08/15/2011   eye  . HERNIA REPAIR  2009  . LUMBAR LAMINECTOMY/ DECOMPRESSION WITH MET-RX  2008  . mandibular tori  [phone]  . MAXIMUM ACCESS (MAS)POSTERIOR LUMBAR INTERBODY FUSION (PLIF) 1 LEVEL  01/12/2010  . [place]  . PROSTATECTOMY  1992  . RADICAL ORCHIECTOMY  2004    . removal of nasal polyps and sinus tissue  1997  . SHOULDER ARTHROSCOPY WITH SUBACROMIAL DECOMPRESSION, ROTATOR CUFF REPAIR AND BICEP TENDON REPAIR  1998  . thumb joint repair Left 1999  . thumb joint repair Right 2004  . TONSILECTOMY, ADENOIDECTOMY, BILATERAL MYRINGOTOMY AND TUBES  1940  . triple by-pass heart surgery  2006  . UVULOPALATOPHARYNGOPLASTY  1996   Social History: Social History   Socioeconomic History  . Marital status: Married    Spouse name: Not on file  . Number of children: Not on file  . Years of education: Not on file  . Highest education level: Not on file  Occupational History  . Not on file  Social Needs  . Financial resource strain: Not on file  . Food insecurity:    Worry: Not on file    Inability: Not on file  . Transportation needs:    Medical: Not on file    Non-medical: Not on file  Tobacco Use  . Smoking status: Former Research scientist (life sciences)  . Smokeless tobacco: Never Used  Substance and Sexual Activity  . Alcohol use: No  . Drug use: No  . Sexual activity: Not on file  Lifestyle  . Physical activity:    Days per week: Not on file    Minutes per session: Not on file  . Stress: Not on file  Relationships  . Social connections:    Talks on phone: Not on file    Gets together: Not on file    Attends religious service: Not on file    Active member of club or organization: Not on file  Attends meetings of clubs or organizations: Not on file    Relationship status: Not on file  Other Topics Concern  . Not on file  Social History Narrative  . Not on file   Family History: No family history on file. Allergies: Allergies  Allergen Reactions  . Aspirin Other (See Comments)    Unknown/Pt states unable to take due to stomach issues and anemia  . Metolazone Other (See Comments)    / Pt denies   Medications: See med rec.  Review of Systems: No fevers, chills, night sweats, weight loss, chest pain, or shortness of breath.   Objective:     General: Well Developed, well nourished, and in no acute distress.  Neuro: Alert and oriented x3, extra-ocular muscles intact, sensation grossly intact.  HEENT: Normocephalic, atraumatic, pupils equal round reactive to light, neck supple, no masses, no lymphadenopathy, thyroid nonpalpable.  Skin: Warm and dry, no rashes. Cardiac: Regular rate and rhythm, no murmurs rubs or gallops, no lower extremity edema.  Respiratory: Clear to auscultation bilaterally. Not using accessory muscles, speaking in full sentences. Right knee: Mild swelling, tenderness to the medial joint line. Varus deformity. ROM normal in flexion and extension and lower leg rotation. Ligaments with solid consistent endpoints including ACL, PCL, LCL, MCL. Negative Mcmurray's and provocative meniscal tests. Non painful patellar compression. Patellar and quadriceps tendons unremarkable. Hamstring and quadriceps strength is normal.  Impression and Recommendations:    Renal insufficiency History of prostate cancer, bilateral ureteral stents. Worsening renal function. We are not going to restart Lasix because of this, I am also going to discontinue his lisinopril. I need an ultrasound to ensure no residual or worsening hydronephrosis and stent obstruction. Rechecking labs today.  Essential hypertension, benign Discontinue lisinopril, return to see me in 2 weeks. If persistently hypertensive we will add amlodipine instead.  Primary osteoarthritis of right knee with CPPD Partial response with aspiration and injection by Dr. Georgina Snell a month ago, calcium pyrophosphate crystals on arthrocentesis. Mild swelling today, moderate pain. Holding off on colchicine for now with current renal function. I would like to consider Coolief radio frequency ablation, he is not a candidate at this point for knee arthroplasty.  Normocytic anemia Rechecking CBC, continue iron supplementation, increasing to 3 times  daily. ___________________________________________ Gwen Her. Dianah Field, M.D., ABFM., CAQSM. Primary Care and Sports Medicine [facility]  Adjunct Professor of [place] of [facility] [employer]

## 2017-12-11 NOTE — Assessment & Plan Note (Signed)
Rechecking CBC, continue iron supplementation, increasing to 3 times daily.

## 2017-12-12 LAB — COMPREHENSIVE METABOLIC PANEL WITH GFR
BUN/Creatinine Ratio: 25 (calc) — ABNORMAL HIGH (ref 6–22)
BUN: 62 mg/dL — ABNORMAL HIGH (ref 7–25)
Globulin: 3 g/dL (ref 1.9–3.7)
Glucose, Bld: 93 mg/dL (ref 65–99)

## 2017-12-12 LAB — COMPREHENSIVE METABOLIC PANEL
AG Ratio: 1.3 (calc) (ref 1.0–2.5)
ALT: 9 U/L (ref 9–46)
AST: 13 U/L (ref 10–35)
Albumin: 4 g/dL (ref 3.6–5.1)
Alkaline phosphatase (APISO): 72 U/L (ref 40–115)
CO2: 24 mmol/L (ref 20–32)
Calcium: 9.1 mg/dL (ref 8.6–10.3)
Chloride: 102 mmol/L (ref 98–110)
Creat: 2.48 mg/dL — ABNORMAL HIGH (ref 0.70–1.11)
Potassium: 4.9 mmol/L (ref 3.5–5.3)
Sodium: 137 mmol/L (ref 135–146)
Total Bilirubin: 0.4 mg/dL (ref 0.2–1.2)
Total Protein: 7 g/dL (ref 6.1–8.1)

## 2017-12-13 ENCOUNTER — Other Ambulatory Visit: Payer: Self-pay | Admitting: Sports Medicine

## 2017-12-15 ENCOUNTER — Ambulatory Visit (INDEPENDENT_AMBULATORY_CARE_PROVIDER_SITE_OTHER): Payer: Medicare HMO

## 2017-12-15 DIAGNOSIS — N289 Disorder of kidney and ureter, unspecified: Secondary | ICD-10-CM

## 2017-12-15 DIAGNOSIS — N1339 Other hydronephrosis: Secondary | ICD-10-CM | POA: Diagnosis not present

## 2017-12-15 DIAGNOSIS — N1330 Unspecified hydronephrosis: Secondary | ICD-10-CM

## 2017-12-23 ENCOUNTER — Encounter: Payer: Self-pay | Admitting: Sports Medicine

## 2017-12-23 ENCOUNTER — Ambulatory Visit (INDEPENDENT_AMBULATORY_CARE_PROVIDER_SITE_OTHER): Payer: Medicare HMO | Admitting: Sports Medicine

## 2017-12-23 DIAGNOSIS — N289 Disorder of kidney and ureter, unspecified: Secondary | ICD-10-CM | POA: Diagnosis not present

## 2017-12-23 DIAGNOSIS — I10 Essential (primary) hypertension: Secondary | ICD-10-CM

## 2017-12-23 DIAGNOSIS — D649 Anemia, unspecified: Secondary | ICD-10-CM

## 2017-12-23 MED ORDER — MAGNESIUM OXIDE 400 MG PO TABS: 800.0000 mg | ORAL_TABLET | Freq: Every day | ORAL | 3 refills | Status: DC

## 2017-12-23 MED ORDER — AMLODIPINE BESYLATE 5 MG PO TABS: 5.0000 mg | ORAL_TABLET | Freq: Every day | ORAL | 3 refills | Status: DC

## 2017-12-23 NOTE — Assessment & Plan Note (Signed)
Pressure is elevated as expected since the discontinuation of Lasix and lisinopril. Adding amlodipine 5 mg daily, return to see me in 1 month recheck blood pressures. Is having nocturnal bilateral lower extremity cramping as well as right worse than left upper extremity cramping. Evaluating for electrolyte disturbances, adding magnesium oxide 800 mg nightly. If persistent cramping we will evaluate further for cervical and lumbar spinal stenosis.

## 2017-12-23 NOTE — Assessment & Plan Note (Signed)
History of prostate cancer, bilateral ureteral stents placed. Worsening renal function, at the last visit we held off of furosemide, as well as discontinued lisinopril due to worsening of renal function. He does have hydronephrosis, but unclear as to whether there is stent obstruction. Stents are due to be exchanged coming up soon. Rechecking labs today.

## 2017-12-23 NOTE — Assessment & Plan Note (Signed)
Normocytic anemia, rechecking CBC today, continue iron supplementation at 3 times daily.

## 2017-12-23 NOTE — Progress Notes (Signed)
Subjective:    CC: Renal failure  HPI: This is a pleasant 84 year old male with prostate cancer, bladder cancer, he is post bilateral ureteral stenting for hydronephrosis.  He has been treated aggressively for hypertension with lisinopril, furosemide.  These were stopped at the last visit, he is here to get some blood work done.  In addition he has developed some cramping in his legs as well as right worse than left arms.  I reviewed the past medical history, family history, social history, surgical history, and allergies today and no changes were needed.  Please see the problem list section below in epic for further details.  Past Medical History: Past Medical History:  Diagnosis Date  . Bladder cancer ([place]) 10/23/2015  . CAD (coronary artery disease) status post bypass grafting 01/29/2013  . Clostridium difficile diarrhea 07/18/2016  . Essential hypertension, benign 01/29/2013  . History of Clostridioides difficile infection 07/18/2016  . History of prostate cancer 01/29/2013  . Hyperlipidemia 01/29/2013  . Primary osteoarthritis of right knee 02/09/2013   Supartz received   . Right lower lobe pneumonia ([place]) 07/03/2017  . Rotator cuff tear arthropathy of left shoulder 04/09/2013  . Skin cancer 11/15/2013   Past Surgical History: Past Surgical History:  Procedure Laterality Date  . APPENDECTOMY  05/03/2012  . CATARACT EXTRACTION Left 06/13/2011   eye  . CATARACT EXTRACTION Right 08/15/2011   eye  . HERNIA REPAIR  2009  . LUMBAR LAMINECTOMY/ DECOMPRESSION WITH MET-RX  2008  . mandibular tori  [phone]  . MAXIMUM ACCESS (MAS)POSTERIOR LUMBAR INTERBODY FUSION (PLIF) 1 LEVEL  01/12/2010  . [place]  . PROSTATECTOMY  1992  . RADICAL ORCHIECTOMY  2004  . removal of nasal polyps and sinus tissue  1997  . SHOULDER ARTHROSCOPY WITH SUBACROMIAL DECOMPRESSION, ROTATOR CUFF REPAIR AND BICEP TENDON REPAIR  1998  . thumb joint repair Left 1999  . thumb joint repair  Right 2004  . TONSILECTOMY, ADENOIDECTOMY, BILATERAL MYRINGOTOMY AND TUBES  1940  . triple by-pass heart surgery  2006  . UVULOPALATOPHARYNGOPLASTY  1996   Social History: Social History   Socioeconomic History  . Marital status: Married    Spouse name: Not on file  . Number of children: Not on file  . Years of education: Not on file  . Highest education level: Not on file  Occupational History  . Not on file  Social Needs  . Financial resource strain: Not on file  . Food insecurity:    Worry: Not on file    Inability: Not on file  . Transportation needs:    Medical: Not on file    Non-medical: Not on file  Tobacco Use  . Smoking status: Former Research scientist (life sciences)  . Smokeless tobacco: Never Used  Substance and Sexual Activity  . Alcohol use: No  . Drug use: No  . Sexual activity: Not on file  Lifestyle  . Physical activity:    Days per week: Not on file    Minutes per session: Not on file  . Stress: Not on file  Relationships  . Social connections:    Talks on phone: Not on file    Gets together: Not on file    Attends religious service: Not on file    Active member of club or organization: Not on file    Attends meetings of clubs or organizations: Not on file    Relationship status: Not on file  Other Topics Concern  . Not on file  Social  History Narrative  . Not on file   Family History: No family history on file. Allergies: Allergies  Allergen Reactions  . Aspirin Other (See Comments)    Unknown/Pt states unable to take due to stomach issues and anemia  . Metolazone Other (See Comments)    / Pt denies   Medications: See med rec.  Review of Systems: No fevers, chills, night sweats, weight loss, chest pain, or shortness of breath.   Objective:    General: Well Developed, well nourished, and in no acute distress.  Neuro: Alert and oriented x3, extra-ocular muscles intact, sensation grossly intact.  HEENT: Normocephalic, atraumatic, pupils equal round reactive to  light, neck supple, no masses, no lymphadenopathy, thyroid nonpalpable.  Skin: Warm and dry, no rashes. Cardiac: Regular rate and rhythm, no murmurs rubs or gallops, no lower extremity edema.  Respiratory: Clear to auscultation bilaterally. Not using accessory muscles, speaking in full sentences.  Impression and Recommendations:    Renal insufficiency History of prostate cancer, bilateral ureteral stents placed. Worsening renal function, at the last visit we held off of furosemide, as well as discontinued lisinopril due to worsening of renal function. He does have hydronephrosis, but unclear as to whether there is stent obstruction. Stents are due to be exchanged coming up soon. Rechecking labs today.  Normocytic anemia Normocytic anemia, rechecking CBC today, continue iron supplementation at 3 times daily.  Essential hypertension, benign Pressure is elevated as expected since the discontinuation of Lasix and lisinopril. Adding amlodipine 5 mg daily, return to see me in 1 month recheck blood pressures. Is having nocturnal bilateral lower extremity cramping as well as right worse than left upper extremity cramping. Evaluating for electrolyte disturbances, adding magnesium oxide 800 mg nightly. If persistent cramping we will evaluate further for cervical and lumbar spinal stenosis. ___________________________________________ Gwen Her. Dianah Field, M.D., ABFM., CAQSM. Primary Care and Sports Medicine [facility]  Adjunct Professor of [place] [employer] [facility] [employer] Medicine

## 2017-12-24 ENCOUNTER — Other Ambulatory Visit: Payer: Self-pay | Admitting: Sports Medicine

## 2017-12-24 DIAGNOSIS — D649 Anemia, unspecified: Secondary | ICD-10-CM

## 2017-12-24 LAB — CBC
HCT: 28.6 % — ABNORMAL LOW (ref 38.5–50.0)
Hemoglobin: 9.6 g/dL — ABNORMAL LOW (ref 13.2–17.1)
MCH: 29.1 pg (ref 27.0–33.0)
MCHC: 33.6 g/dL (ref 32.0–36.0)
MCV: 86.7 fL (ref 80.0–100.0)
MPV: 9.6 fL (ref 7.5–12.5)
Platelets: 272 10*3/uL (ref 140–400)
RBC: 3.3 Million/uL — ABNORMAL LOW (ref 4.20–5.80)
RDW: 14.6 % (ref 11.0–15.0)
WBC: 6.7 10*3/uL (ref 3.8–10.8)

## 2017-12-24 LAB — MAGNESIUM: Magnesium: 2.2 mg/dL (ref 1.5–2.5)

## 2017-12-24 LAB — COMPREHENSIVE METABOLIC PANEL
AG Ratio: 1.3 (calc) (ref 1.0–2.5)
ALT: 9 U/L (ref 9–46)
BUN/Creatinine Ratio: 19 (calc) (ref 6–22)
BUN: 38 mg/dL — ABNORMAL HIGH (ref 7–25)
CO2: 24 mmol/L (ref 20–32)
Creat: 2.05 mg/dL — ABNORMAL HIGH (ref 0.70–1.11)
Glucose, Bld: 89 mg/dL (ref 65–99)
Sodium: 137 mmol/L (ref 135–146)
Total Protein: 6.7 g/dL (ref 6.1–8.1)

## 2017-12-24 LAB — COMPREHENSIVE METABOLIC PANEL WITH GFR
AST: 13 U/L (ref 10–35)
Albumin: 3.8 g/dL (ref 3.6–5.1)
Alkaline phosphatase (APISO): 72 U/L (ref 40–115)
Calcium: 8.7 mg/dL (ref 8.6–10.3)
Chloride: 106 mmol/L (ref 98–110)
Globulin: 2.9 g/dL (ref 1.9–3.7)
Potassium: 5.1 mmol/L (ref 3.5–5.3)
Total Bilirubin: 0.3 mg/dL (ref 0.2–1.2)

## 2017-12-24 LAB — TSH: TSH: 1.78 m[IU]/L (ref 0.40–4.50)

## 2017-12-29 DIAGNOSIS — N135 Crossing vessel and stricture of ureter without hydronephrosis: Secondary | ICD-10-CM | POA: Diagnosis not present

## 2017-12-29 DIAGNOSIS — Z951 Presence of aortocoronary bypass graft: Secondary | ICD-10-CM | POA: Diagnosis not present

## 2017-12-29 DIAGNOSIS — I252 Old myocardial infarction: Secondary | ICD-10-CM | POA: Diagnosis not present

## 2017-12-29 DIAGNOSIS — I2510 Atherosclerotic heart disease of native coronary artery without angina pectoris: Secondary | ICD-10-CM | POA: Diagnosis not present

## 2017-12-29 DIAGNOSIS — E785 Hyperlipidemia, unspecified: Secondary | ICD-10-CM | POA: Diagnosis not present

## 2017-12-29 DIAGNOSIS — Z87891 Personal history of nicotine dependence: Secondary | ICD-10-CM | POA: Diagnosis not present

## 2017-12-29 DIAGNOSIS — N132 Hydronephrosis with renal and ureteral calculous obstruction: Secondary | ICD-10-CM | POA: Diagnosis not present

## 2017-12-29 DIAGNOSIS — Z79899 Other long term (current) drug therapy: Secondary | ICD-10-CM | POA: Diagnosis not present

## 2017-12-29 DIAGNOSIS — I498 Other specified cardiac arrhythmias: Secondary | ICD-10-CM | POA: Diagnosis not present

## 2017-12-29 DIAGNOSIS — K8020 Calculus of gallbladder without cholecystitis without obstruction: Secondary | ICD-10-CM | POA: Diagnosis not present

## 2017-12-29 DIAGNOSIS — Z981 Arthrodesis status: Secondary | ICD-10-CM | POA: Diagnosis not present

## 2017-12-29 DIAGNOSIS — C679 Malignant neoplasm of bladder, unspecified: Secondary | ICD-10-CM | POA: Diagnosis not present

## 2017-12-29 DIAGNOSIS — C670 Malignant neoplasm of trigone of bladder: Secondary | ICD-10-CM | POA: Diagnosis not present

## 2017-12-30 DIAGNOSIS — E785 Hyperlipidemia, unspecified: Secondary | ICD-10-CM | POA: Diagnosis not present

## 2017-12-30 DIAGNOSIS — Z87891 Personal history of nicotine dependence: Secondary | ICD-10-CM | POA: Diagnosis not present

## 2017-12-30 DIAGNOSIS — I2510 Atherosclerotic heart disease of native coronary artery without angina pectoris: Secondary | ICD-10-CM | POA: Diagnosis not present

## 2017-12-30 DIAGNOSIS — I498 Other specified cardiac arrhythmias: Secondary | ICD-10-CM | POA: Diagnosis not present

## 2017-12-30 DIAGNOSIS — I252 Old myocardial infarction: Secondary | ICD-10-CM | POA: Diagnosis not present

## 2017-12-30 DIAGNOSIS — N135 Crossing vessel and stricture of ureter without hydronephrosis: Secondary | ICD-10-CM | POA: Diagnosis not present

## 2017-12-30 DIAGNOSIS — Z79899 Other long term (current) drug therapy: Secondary | ICD-10-CM | POA: Diagnosis not present

## 2017-12-30 DIAGNOSIS — C679 Malignant neoplasm of bladder, unspecified: Secondary | ICD-10-CM | POA: Diagnosis not present

## 2017-12-30 DIAGNOSIS — Z951 Presence of aortocoronary bypass graft: Secondary | ICD-10-CM | POA: Diagnosis not present

## 2018-01-01 DIAGNOSIS — C679 Malignant neoplasm of bladder, unspecified: Secondary | ICD-10-CM | POA: Diagnosis not present

## 2018-01-23 ENCOUNTER — Encounter: Payer: Self-pay | Admitting: Sports Medicine

## 2018-01-23 ENCOUNTER — Ambulatory Visit (INDEPENDENT_AMBULATORY_CARE_PROVIDER_SITE_OTHER): Payer: Medicare HMO | Admitting: Sports Medicine

## 2018-01-23 DIAGNOSIS — M1711 Unilateral primary osteoarthritis, right knee: Secondary | ICD-10-CM

## 2018-01-23 DIAGNOSIS — I10 Essential (primary) hypertension: Secondary | ICD-10-CM | POA: Diagnosis not present

## 2018-01-23 MED ORDER — AMLODIPINE BESYLATE 5 MG PO TABS: 5.0000 mg | ORAL_TABLET | Freq: Every day | ORAL | 3 refills | Status: DC

## 2018-01-23 MED ORDER — MAGNESIUM OXIDE 400 MG PO TABS: 800.0000 mg | ORAL_TABLET | Freq: Every day | ORAL | 3 refills | Status: AC

## 2018-01-23 NOTE — Assessment & Plan Note (Signed)
Repeat aspiration. He does have CPPD. Renal function is abnormal so we are going to hold off on colchicine. Certainly he would be a candidate for Coolief radiofrequency ablation.

## 2018-01-23 NOTE — Progress Notes (Signed)
Subjective:    CC: Follow-up  HPI: Nocturnal cramping: Never actually got the magnesium oxide.  Hypertension: We had to discontinue lisinopril and Lasix due to renal insufficiency, he never actually got the amlodipine.  No headaches, chest pain, visual changes.  Knee osteoarthritis: Reaccumulation of fluid, is now getting painful, narcotics are not effective, desires aspiration.  I reviewed the past medical history, family history, social history, surgical history, and allergies today and no changes were needed.  Please see the problem list section below in epic for further details.  Past Medical History: Past Medical History:  Diagnosis Date  . Bladder cancer ([place]) 10/23/2015  . CAD (coronary artery disease) status post bypass grafting 01/29/2013  . Clostridium difficile diarrhea 07/18/2016  . Essential hypertension, benign 01/29/2013  . History of Clostridioides difficile infection 07/18/2016  . History of prostate cancer 01/29/2013  . Hyperlipidemia 01/29/2013  . Primary osteoarthritis of right knee 02/09/2013   Supartz received   . Right lower lobe pneumonia ([place]) 07/03/2017  . Rotator cuff tear arthropathy of left shoulder 04/09/2013  . Skin cancer 11/15/2013   Past Surgical History: Past Surgical History:  Procedure Laterality Date  . APPENDECTOMY  05/03/2012  . CATARACT EXTRACTION Left 06/13/2011   eye  . CATARACT EXTRACTION Right 08/15/2011   eye  . HERNIA REPAIR  2009  . LUMBAR LAMINECTOMY/ DECOMPRESSION WITH MET-RX  2008  . mandibular tori  [phone]  . MAXIMUM ACCESS (MAS)POSTERIOR LUMBAR INTERBODY FUSION (PLIF) 1 LEVEL  01/12/2010  . [place]  . PROSTATECTOMY  1992  . RADICAL ORCHIECTOMY  2004  . removal of nasal polyps and sinus tissue  1997  . SHOULDER ARTHROSCOPY WITH SUBACROMIAL DECOMPRESSION, ROTATOR CUFF REPAIR AND BICEP TENDON REPAIR  1998  . thumb joint repair Left 1999  . thumb joint repair Right 2004  . TONSILECTOMY, ADENOIDECTOMY,  BILATERAL MYRINGOTOMY AND TUBES  1940  . triple by-pass heart surgery  2006  . UVULOPALATOPHARYNGOPLASTY  1996   Social History: Social History   Socioeconomic History  . Marital status: Married    Spouse name: Not on file  . Number of children: Not on file  . Years of education: Not on file  . Highest education level: Not on file  Occupational History  . Not on file  Social Needs  . Financial resource strain: Not on file  . Food insecurity:    Worry: Not on file    Inability: Not on file  . Transportation needs:    Medical: Not on file    Non-medical: Not on file  Tobacco Use  . Smoking status: Former Research scientist (life sciences)  . Smokeless tobacco: Never Used  Substance and Sexual Activity  . Alcohol use: No  . Drug use: No  . Sexual activity: Not on file  Lifestyle  . Physical activity:    Days per week: Not on file    Minutes per session: Not on file  . Stress: Not on file  Relationships  . Social connections:    Talks on phone: Not on file    Gets together: Not on file    Attends religious service: Not on file    Active member of club or organization: Not on file    Attends meetings of clubs or organizations: Not on file    Relationship status: Not on file  Other Topics Concern  . Not on file  Social History Narrative  . Not on file   Family History: No family history on file. Allergies:  Allergies  Allergen Reactions  . Aspirin Other (See Comments)    Unknown/Pt states unable to take due to stomach issues and anemia  . Metolazone Other (See Comments)    / Pt denies   Medications: See med rec.  Review of Systems: No fevers, chills, night sweats, weight loss, chest pain, or shortness of breath.   Objective:    General: Well Developed, well nourished, and in no acute distress.  Neuro: Alert and oriented x3, extra-ocular muscles intact, sensation grossly intact.  HEENT: Normocephalic, atraumatic, pupils equal round reactive to light, neck supple, no masses, no  lymphadenopathy, thyroid nonpalpable.  Skin: Warm and dry, no rashes. Cardiac: Regular rate and rhythm, no murmurs rubs or gallops, no lower extremity edema.  Respiratory: Clear to auscultation bilaterally. Not using accessory muscles, speaking in full sentences. Right knee: Visibly swollen with a palpable fluid wave and effusion. ROM normal in flexion and extension and lower leg rotation. Ligaments with solid consistent endpoints including ACL, PCL, LCL, MCL. Negative Mcmurray's and provocative meniscal tests. Non painful patellar compression. Patellar and quadriceps tendons unremarkable. Hamstring and quadriceps strength is normal.  Procedure: Real-time Ultrasound Guided aspiration of right knee Device: GE Logiq E  Verbal informed consent obtained.  Time-out conducted.  Noted no overlying erythema, induration, or other signs of local infection.  Skin prepped in a sterile fashion.  Local anesthesia: Topical Ethyl chloride.  With sterile technique and under real time ultrasound guidance: Using an 18-gauge needle I aspirated 45 cc of slightly cloudy, straw-colored fluid. Completed without difficulty  Pain immediately resolved suggesting accurate placement of the medication.  Advised to call if fevers/chills, erythema, induration, drainage, or persistent bleeding.  Images permanently stored and available for review in the ultrasound unit.  Impression: Technically successful ultrasound guided injection.  Impression and Recommendations:    Essential hypertension, benign Blood pressure was elevated as expected as Lasix and lisinopril were discontinued due to renal insufficiency at the last visit. We did add amlodipine 5 mg daily but it does not sound like he is taking it. He was having some lower extremity cramping so we added magnesium oxide nightly, apparently he is not taking this either. Pretty much no changes for this visit, return in a month. If persistent cramping we will evaluate  further for cervical and lumbar spinal stenosis.  Primary osteoarthritis of right knee with CPPD Repeat aspiration. He does have CPPD. Renal function is abnormal so we are going to hold off on colchicine. Certainly he would be a candidate for Coolief radiofrequency ablation. ___________________________________________ Gwen Her. Dianah Field, M.D., ABFM., CAQSM. Primary Care and Sports Medicine [place] [facility]  Adjunct Professor of [place] [employer] [facility] [employer]

## 2018-01-23 NOTE — Assessment & Plan Note (Signed)
Blood pressure was elevated as expected as Lasix and lisinopril were discontinued due to renal insufficiency at the last visit. We did add amlodipine 5 mg daily but it does not sound like he is taking it. He was having some lower extremity cramping so we added magnesium oxide nightly, apparently he is not taking this either. Pretty much no changes for this visit, return in a month. If persistent cramping we will evaluate further for cervical and lumbar spinal stenosis.

## 2018-01-31 ENCOUNTER — Other Ambulatory Visit: Payer: Self-pay | Admitting: Sports Medicine

## 2018-02-01 ENCOUNTER — Other Ambulatory Visit: Payer: Self-pay | Admitting: Sports Medicine

## 2018-02-05 ENCOUNTER — Emergency Department (INDEPENDENT_AMBULATORY_CARE_PROVIDER_SITE_OTHER)
Admission: EM | Admit: 2018-02-05 | Discharge: 2018-02-05 | Disposition: A | Discharge status: Home / Self Care | Payer: Medicare HMO | Source: Home / Self Care | Attending: Family Medicine | Admitting: Family Medicine

## 2018-02-05 ENCOUNTER — Encounter: Payer: Self-pay | Admitting: Emergency Medicine

## 2018-02-05 ENCOUNTER — Other Ambulatory Visit: Payer: Self-pay

## 2018-02-05 DIAGNOSIS — J209 Acute bronchitis, unspecified: Secondary | ICD-10-CM

## 2018-02-05 MED ORDER — DOXYCYCLINE HYCLATE 100 MG PO CAPS: 100.0000 mg | ORAL_CAPSULE | Freq: Two times a day (BID) | ORAL | 0 refills | Status: DC

## 2018-02-05 MED ORDER — BENZONATATE 200 MG PO CAPS: ORAL_CAPSULE | ORAL | 0 refills | Status: DC

## 2018-02-05 NOTE — ED Triage Notes (Signed)
Patient states he has had a cough for 4-5 days; his wife has also been coughing; denies fever; has tried Coricidin.

## 2018-02-05 NOTE — Discharge Instructions (Addendum)
Take plain guaifenesin (1200mg  extended release tabs such as Mucinex) twice daily, with plenty of water, for cough and congestion.  Get adequate rest.   May use Afrin nasal spray (or generic oxymetazoline) each morning for about 5 days and then discontinue.  Also recommend using saline nasal spray several times daily and saline nasal irrigation (AYR is a common brand).    Try warm salt water gargles for sore throat.  Stop all antihistamines (such as Coricicin) for now, and other non-prescription cough/cold preparations.   If symptoms become significantly worse during the night or over the weekend, proceed to the local emergency room.

## 2018-02-05 NOTE — ED Provider Notes (Signed)
[facility]    CSN: [id number] Arrival date & time: 02/05/18  1534     History   Chief Complaint Chief Complaint  Patient presents with  . Cough    HPI Jeffery Hart is a 85 y.o. male.   Patient complains of 4 to 5 day history of typical cold-like symptoms developing over several days, including mild sore throat, sinus congestion, fatigue, and cough that is worse at night.  He denies fever, pleuritic pain, and shortness of breath. He has a past history of pneumonia.   The history is provided by the patient.    Past Medical History:  Diagnosis Date  . Bladder cancer ([place]) 10/23/2015  . CAD (coronary artery disease) status post bypass grafting 01/29/2013  . Clostridium difficile diarrhea 07/18/2016  . Essential hypertension, benign 01/29/2013  . History of Clostridioides difficile infection 07/18/2016  . History of prostate cancer 01/29/2013  . Hyperlipidemia 01/29/2013  . Primary osteoarthritis of right knee 02/09/2013   Supartz received   . Right lower lobe pneumonia ([place]) 07/03/2017  . Rotator cuff tear arthropathy of left shoulder 04/09/2013  . Skin cancer 11/15/2013    Patient Active Problem List   Diagnosis Date Noted  . Renal insufficiency 12/11/2017  . History of Clostridioides difficile infection 07/18/2016  . Bladder cancer ([place]) 10/23/2015  . Normocytic anemia 05/22/2015  . Skin cancer 11/15/2013  . Rotator cuff tear arthropathy of left shoulder 04/09/2013  . Primary osteoarthritis of right knee with CPPD 02/09/2013  . Essential hypertension, benign 01/29/2013  . CAD (coronary artery disease) status post bypass grafting 01/29/2013  . Annual physical exam 01/29/2013  . History of prostate cancer 01/29/2013  . Lymphedema of right leg 01/29/2013  . Hyperlipidemia 01/29/2013    Past Surgical History:  Procedure Laterality Date  . APPENDECTOMY  05/03/2012  . CATARACT EXTRACTION Left 06/13/2011   eye  . CATARACT EXTRACTION Right 08/15/2011   eye  .  HERNIA REPAIR  2009  . LUMBAR LAMINECTOMY/ DECOMPRESSION WITH MET-RX  2008  . mandibular tori  [phone]  . MAXIMUM ACCESS (MAS)POSTERIOR LUMBAR INTERBODY FUSION (PLIF) 1 LEVEL  01/12/2010  . [place]  . PROSTATECTOMY  1992  . RADICAL ORCHIECTOMY  2004  . removal of nasal polyps and sinus tissue  1997  . SHOULDER ARTHROSCOPY WITH SUBACROMIAL DECOMPRESSION, ROTATOR CUFF REPAIR AND BICEP TENDON REPAIR  1998  . thumb joint repair Left 1999  . thumb joint repair Right 2004  . TONSILECTOMY, ADENOIDECTOMY, BILATERAL MYRINGOTOMY AND TUBES  1940  . triple by-pass heart surgery  2006  . UVULOPALATOPHARYNGOPLASTY  1996       Home Medications    Prior to Admission medications   Medication Sig Start Date End Date Taking? Authorizing Provider  acyclovir (ZOVIRAX) 800 MG tablet TAKE 1 TABLET (800 MG TOTAL) BY MOUTH 3 (THREE) TIMES DAILY. 04/02/16   Silverio Decamp, MD  AMBULATORY NON FORMULARY MEDICATION Knee-high, medium compression, graduated compression stockings. Apply to lower extremities. [email or web address], Zippered Compression Stockings, medium circ, long length 05/18/15   Silverio Decamp, MD  amLODipine (NORVASC) 5 MG tablet Take 1 tablet (5 mg total) by mouth daily. 01/23/18   Silverio Decamp, MD  benzonatate (TESSALON) 200 MG capsule Take one cap by mouth at bedtime as needed for cough.  May repeat in 4 to 6 hours 02/05/18   Kandra Nicolas, MD  carvedilol (COREG) 25 MG tablet TAKE 1 TABLET (25 MG TOTAL) BY MOUTH 2 (TWO)  TIMES DAILY WITH A MEAL. 11/24/17   Silverio Decamp, MD  cyclobenzaprine (FLEXERIL) 10 MG tablet Take 1 tablet (10 mg total) by mouth every 8 (eight) hours as needed for muscle spasms. 11/15/13   Silverio Decamp, MD  doxepin (SINEQUAN) 25 MG capsule TAKE 1 CAPSULE (25 MG TOTAL) BY MOUTH AT BEDTIME. 09/29/17   Silverio Decamp, MD  doxycycline (VIBRAMYCIN) 100 MG capsule Take 1 capsule (100 mg total) by  mouth 2 (two) times daily. Take with food. 02/05/18   Kandra Nicolas, MD  ferrous sulfate 325 (65 FE) MG EC tablet Take 1 tablet (325 mg total) by mouth 3 (three) times daily. 12/24/17   Silverio Decamp, MD  fluorouracil (EFUDEX) 5 % cream Apply topically 2 (two) times daily.    [provider]  gabapentin (NEURONTIN) 300 MG capsule TAKE 1 CAPSULE (300 MG TOTAL) BY MOUTH 3 (THREE) TIMES DAILY. 10/31/17   Silverio Decamp, MD  HYDROcodone-acetaminophen (NORCO) 7.5-325 MG tablet Take 1 tablet by mouth every 8 (eight) hours as needed. 07/03/17   Silverio Decamp, MD  lisinopril (PRINIVIL,ZESTRIL) 40 MG tablet TAKE 1 TABLET (40 MG TOTAL) BY MOUTH DAILY. 02/01/18   Silverio Decamp, MD  magnesium oxide (MAG-OX) 400 MG tablet Take 2 tablets (800 mg total) by mouth at bedtime. 01/23/18   Silverio Decamp, MD  nitroGLYCERIN (NITROSTAT) 0.4 MG SL tablet Place 0.4 mg under the tongue as needed for chest pain.    [provider]  omeprazole (PRILOSEC) 40 MG capsule Take 1 capsule (40 mg total) by mouth daily. Take in the morning and at dinnertime. 06/19/15   Silverio Decamp, MD  simvastatin (ZOCOR) 40 MG tablet TAKE 1 TABLET (40 MG TOTAL) BY MOUTH DAILY AT 6 PM. 01/31/18   Silverio Decamp, MD  sodium chloride 1 g tablet TAKE 1 TABLET (1 G TOTAL) BY MOUTH 3 (THREE) TIMES DAILY. 12/13/17   Silverio Decamp, MD  tamsulosin (FLOMAX) 0.4 MG CAPS capsule  10/11/15   [provider]    Family History No family history on file.  Social History Social History   Tobacco Use  . Smoking status: Former Research scientist (life sciences)  . Smokeless tobacco: Never Used  Substance Use Topics  . Alcohol use: No  . Drug use: No     Allergies   Aspirin and Metolazone   Review of Systems Review of Systems + sore throat + cough No pleuritic pain No wheezing + nasal congestion + post-nasal drainage No sinus pain/pressure No itchy/red eyes No earache No  hemoptysis No SOB No fever, + chills No nausea No vomiting No abdominal pain No diarrhea No urinary symptoms No skin rash + fatigue No myalgias No headache Used OTC meds without relief   Physical Exam Triage Vital Signs ED Triage Vitals  Enc Vitals Group     BP 02/05/18 1640 139/62     Pulse Rate 02/05/18 1640 (!) 57     Resp 02/05/18 1640 18     Temp 02/05/18 1640 97.6 F (36.4 C)     Temp Source 02/05/18 1640 Oral     SpO2 02/05/18 1640 97 %     Weight 02/05/18 1641 140 lb (63.5 kg)     Height 02/05/18 1641 '5\' 3"'  (1.6 m)     Head Circumference --      Peak Flow --      Pain Score 02/05/18 1641 0     Pain Loc --  Pain Edu? --      Excl. in [place]? --    No data found.  Updated Vital Signs BP 139/62 (BP Location: Right Arm)   Pulse (!) 57   Temp 97.6 F (36.4 C) (Oral)   Resp 18   Ht '5\' 3"'  (1.6 m)   Wt 63.5 kg   SpO2 97%   BMI 24.80 kg/m   Visual Acuity Right Eye Distance:   Left Eye Distance:   Bilateral Distance:    Right Eye Near:   Left Eye Near:    Bilateral Near:     Physical Exam Nursing notes and Vital Signs reviewed. Appearance:  Patient appears stated age, and in no acute distress Eyes:  Pupils are equal, round, and reactive to light and accomodation.  Extraocular movement is intact.  Conjunctivae are not inflamed  Ears:  Canals normal.  Tympanic membranes normal.  Nose:  Mildly congested turbinates.  No sinus tenderness.   Pharynx:  Normal Neck:  Supple.  Enlarged posterior/lateral nodes are palpated bilaterally, tender to palpation on the left.   Lungs:  Bilateral expiratory wheezes present.  Breath sounds are equal.  Moving air well. Heart:  Regular rate and rhythm without murmurs, rubs, or gallops.  Abdomen:  Nontender without masses or hepatosplenomegaly.  Bowel sounds are present.  No CVA or flank tenderness.  Extremities:  No edema.  Skin:  No rash present.    UC Treatments / Results  Labs (all labs ordered are listed, but only  abnormal results are displayed) Labs Reviewed - No data to display  EKG None  Radiology No results found.  Procedures Procedures (including critical care time)  Medications Ordered in UC Medications - No data to display  Initial Impression / Assessment and Plan / UC Course  I have reviewed the triage vital signs and the nursing notes.  Pertinent labs & imaging results that were available during my care of the patient were reviewed by me and considered in my medical decision making (see chart for details).    Begin empiric doxycycline.  Prescription written for Benzonatate [facility]) to take at bedtime for night-time cough.  Followup with Family Doctor if not improved in one week.    Final Clinical Impressions(s) / UC Diagnoses   Final diagnoses:  Acute bronchitis, unspecified organism     Discharge Instructions     Take plain guaifenesin (1247m extended release tabs such as Mucinex) twice daily, with plenty of water, for cough and congestion.  Get adequate rest.   May use Afrin nasal spray (or generic oxymetazoline) each morning for about 5 days and then discontinue.  Also recommend using saline nasal spray several times daily and saline nasal irrigation (AYR is a common brand).    Try warm salt water gargles for sore throat.  Stop all antihistamines (such as Coricicin) for now, and other non-prescription cough/cold preparations.   If symptoms become significantly worse during the night or over the weekend, proceed to the local emergency room.     ED Prescriptions    Medication Sig Dispense Auth. Provider   doxycycline (VIBRAMYCIN) 100 MG capsule Take 1 capsule (100 mg total) by mouth 2 (two) times daily. Take with food. 14 capsule BKandra Nicolas MD   benzonatate (TESSALON) 200 MG capsule Take one cap by mouth at bedtime as needed for cough.  May repeat in 4 to 6 hours 15 capsule BAssunta FoundSIshmael Holter MD        BKandra Nicolas MD 02/07/18 1352

## 2018-02-10 DIAGNOSIS — Z8546 Personal history of malignant neoplasm of prostate: Secondary | ICD-10-CM | POA: Diagnosis not present

## 2018-02-10 DIAGNOSIS — E222 Syndrome of inappropriate secretion of antidiuretic hormone: Secondary | ICD-10-CM | POA: Diagnosis not present

## 2018-02-10 DIAGNOSIS — D649 Anemia, unspecified: Secondary | ICD-10-CM | POA: Diagnosis not present

## 2018-02-10 DIAGNOSIS — Z951 Presence of aortocoronary bypass graft: Secondary | ICD-10-CM | POA: Diagnosis not present

## 2018-02-10 DIAGNOSIS — G4733 Obstructive sleep apnea (adult) (pediatric): Secondary | ICD-10-CM | POA: Diagnosis not present

## 2018-02-10 DIAGNOSIS — E785 Hyperlipidemia, unspecified: Secondary | ICD-10-CM | POA: Diagnosis not present

## 2018-02-10 DIAGNOSIS — D509 Iron deficiency anemia, unspecified: Secondary | ICD-10-CM | POA: Diagnosis not present

## 2018-02-10 DIAGNOSIS — I2510 Atherosclerotic heart disease of native coronary artery without angina pectoris: Secondary | ICD-10-CM | POA: Diagnosis not present

## 2018-02-10 DIAGNOSIS — C679 Malignant neoplasm of bladder, unspecified: Secondary | ICD-10-CM | POA: Diagnosis not present

## 2018-02-17 ENCOUNTER — Ambulatory Visit (INDEPENDENT_AMBULATORY_CARE_PROVIDER_SITE_OTHER): Payer: Medicare HMO | Admitting: Sports Medicine

## 2018-02-17 ENCOUNTER — Encounter: Payer: Self-pay | Admitting: Sports Medicine

## 2018-02-17 DIAGNOSIS — M1711 Unilateral primary osteoarthritis, right knee: Secondary | ICD-10-CM

## 2018-02-17 NOTE — Progress Notes (Signed)
Subjective:    CC: Right knee swelling  HPI: This is a very pleasant 85 year old male with a history of right knee osteoarthritis, we have done frequent aspirations and occasional injections.  He is now having a recurrence of swelling and pain, localized in the suprapatellar recess, moderate, persistent without radiation.  Previous arthrocentesis and crystal analysis did show pseudogout.  We have been avoiding colchicine due to his GFR.  I reviewed the past medical history, family history, social history, surgical history, and allergies today and no changes were needed.  Please see the problem list section below in epic for further details.  Past Medical History: Past Medical History:  Diagnosis Date  . Bladder cancer ([place]) 10/23/2015  . CAD (coronary artery disease) status post bypass grafting 01/29/2013  . Clostridium difficile diarrhea 07/18/2016  . Essential hypertension, benign 01/29/2013  . History of Clostridioides difficile infection 07/18/2016  . History of prostate cancer 01/29/2013  . Hyperlipidemia 01/29/2013  . Primary osteoarthritis of right knee 02/09/2013   Supartz received   . Right lower lobe pneumonia ([place]) 07/03/2017  . Rotator cuff tear arthropathy of left shoulder 04/09/2013  . Skin cancer 11/15/2013   Past Surgical History: Past Surgical History:  Procedure Laterality Date  . APPENDECTOMY  05/03/2012  . CATARACT EXTRACTION Left 06/13/2011   eye  . CATARACT EXTRACTION Right 08/15/2011   eye  . HERNIA REPAIR  2009  . LUMBAR LAMINECTOMY/ DECOMPRESSION WITH MET-RX  2008  . mandibular tori  [phone]  . MAXIMUM ACCESS (MAS)POSTERIOR LUMBAR INTERBODY FUSION (PLIF) 1 LEVEL  01/12/2010  . [place]  . PROSTATECTOMY  1992  . RADICAL ORCHIECTOMY  2004  . removal of nasal polyps and sinus tissue  1997  . SHOULDER ARTHROSCOPY WITH SUBACROMIAL DECOMPRESSION, ROTATOR CUFF REPAIR AND BICEP TENDON REPAIR  1998  . thumb joint repair Left 1999  . thumb  joint repair Right 2004  . TONSILECTOMY, ADENOIDECTOMY, BILATERAL MYRINGOTOMY AND TUBES  1940  . triple by-pass heart surgery  2006  . UVULOPALATOPHARYNGOPLASTY  1996   Social History: Social History   Socioeconomic History  . Marital status: Married    Spouse name: Not on file  . Number of children: Not on file  . Years of education: Not on file  . Highest education level: Not on file  Occupational History  . Not on file  Social Needs  . Financial resource strain: Not on file  . Food insecurity:    Worry: Not on file    Inability: Not on file  . Transportation needs:    Medical: Not on file    Non-medical: Not on file  Tobacco Use  . Smoking status: Former Research scientist (life sciences)  . Smokeless tobacco: Never Used  Substance and Sexual Activity  . Alcohol use: No  . Drug use: No  . Sexual activity: Not on file  Lifestyle  . Physical activity:    Days per week: Not on file    Minutes per session: Not on file  . Stress: Not on file  Relationships  . Social connections:    Talks on phone: Not on file    Gets together: Not on file    Attends religious service: Not on file    Active member of club or organization: Not on file    Attends meetings of clubs or organizations: Not on file    Relationship status: Not on file  Other Topics Concern  . Not on file  Social History Narrative  .  Not on file   Family History: No family history on file. Allergies: Allergies  Allergen Reactions  . Aspirin Other (See Comments)    Unknown/Pt states unable to take due to stomach issues and anemia  . Metolazone Other (See Comments)    / Pt denies   Medications: See med rec.  Review of Systems: No fevers, chills, night sweats, weight loss, chest pain, or shortness of breath.   Objective:    General: Well Developed, well nourished, and in no acute distress.  Neuro: Alert and oriented x3, extra-ocular muscles intact, sensation grossly intact.  HEENT: Normocephalic, atraumatic, pupils equal  round reactive to light, neck supple, no masses, no lymphadenopathy, thyroid nonpalpable.  Skin: Warm and dry, no rashes. Cardiac: Regular rate and rhythm, no murmurs rubs or gallops, no lower extremity edema.  Respiratory: Clear to auscultation bilaterally. Not using accessory muscles, speaking in full sentences. Right knee: Normal to inspection with no erythema or effusion or obvious bony abnormalities. Palpation normal with no warmth or joint line tenderness or patellar tenderness or condyle tenderness. ROM normal in flexion and extension and lower leg rotation. Ligaments with solid consistent endpoints including ACL, PCL, LCL, MCL. Negative Mcmurray's and provocative meniscal tests. Non painful patellar compression. Patellar and quadriceps tendons unremarkable. Hamstring and quadriceps strength is normal.  Procedure: Real-time Ultrasound Guided aspiration/injection of right knee Device: GE Logiq E  Verbal informed consent obtained.  Time-out conducted.  Noted no overlying erythema, induration, or other signs of local infection.  Skin prepped in a sterile fashion.  Local anesthesia: Topical Ethyl chloride.  With sterile technique and under real time ultrasound guidance: Using an 18-gauge needle advanced into the suprapatellar recess, aspirated 51 cc of slightly cloudy, straw-colored fluid, syringe switched and 1 cc Kenalog 40, 2 cc lidocaine, 2 cc bupivacaine injected easily. Completed without difficulty  Pain immediately resolved suggesting accurate placement of the medication.  Advised to call if fevers/chills, erythema, induration, drainage, or persistent bleeding.  Images permanently stored and available for review in the ultrasound unit.  Impression: Technically successful ultrasound guided injection.  Impression and Recommendations:    Primary osteoarthritis of right knee with CPPD It has now been 3 months, repeat aspiration with injection at this time. He does have calcium  pyrophosphate deposition disease but renal function is not sufficient to do colchicine. Again, if he does not get a solid 3 months of relief we will refer for genicular radiofrequency ablation. ___________________________________________ Gwen Her. Dianah Field, M.D., ABFM., CAQSM. Primary Care and Sports Medicine [facility]  Adjunct Professor of [place] [employer] [facility] [employer]

## 2018-02-17 NOTE — Assessment & Plan Note (Signed)
It has now been 3 months, repeat aspiration with injection at this time. He does have calcium pyrophosphate deposition disease but renal function is not sufficient to do colchicine. Again, if he does not get a solid 3 months of relief we will refer for genicular radiofrequency ablation.

## 2018-02-27 ENCOUNTER — Ambulatory Visit: Payer: Medicare HMO | Admitting: Sports Medicine

## 2018-03-02 DIAGNOSIS — G4733 Obstructive sleep apnea (adult) (pediatric): Secondary | ICD-10-CM | POA: Diagnosis not present

## 2018-03-16 DIAGNOSIS — N135 Crossing vessel and stricture of ureter without hydronephrosis: Secondary | ICD-10-CM | POA: Diagnosis not present

## 2018-03-16 DIAGNOSIS — R319 Hematuria, unspecified: Secondary | ICD-10-CM | POA: Diagnosis not present

## 2018-03-16 DIAGNOSIS — C679 Malignant neoplasm of bladder, unspecified: Secondary | ICD-10-CM | POA: Diagnosis not present

## 2018-03-19 ENCOUNTER — Other Ambulatory Visit: Payer: Self-pay | Admitting: Sports Medicine

## 2018-03-19 DIAGNOSIS — I10 Essential (primary) hypertension: Secondary | ICD-10-CM

## 2018-03-25 ENCOUNTER — Other Ambulatory Visit: Payer: Self-pay | Admitting: Sports Medicine

## 2018-03-25 MED ORDER — FUROSEMIDE 80 MG PO TABS: 80.0000 mg | ORAL_TABLET | Freq: Every day | ORAL | 2 refills | Status: DC | PRN

## 2018-03-26 ENCOUNTER — Other Ambulatory Visit: Payer: Self-pay | Admitting: Sports Medicine

## 2018-03-30 DIAGNOSIS — H903 Sensorineural hearing loss, bilateral: Secondary | ICD-10-CM | POA: Diagnosis not present

## 2018-04-14 DIAGNOSIS — C679 Malignant neoplasm of bladder, unspecified: Secondary | ICD-10-CM | POA: Diagnosis not present

## 2018-04-14 DIAGNOSIS — I2510 Atherosclerotic heart disease of native coronary artery without angina pectoris: Secondary | ICD-10-CM | POA: Diagnosis not present

## 2018-04-14 DIAGNOSIS — I10 Essential (primary) hypertension: Secondary | ICD-10-CM | POA: Diagnosis not present

## 2018-04-14 DIAGNOSIS — Z87891 Personal history of nicotine dependence: Secondary | ICD-10-CM | POA: Diagnosis not present

## 2018-04-14 DIAGNOSIS — I252 Old myocardial infarction: Secondary | ICD-10-CM | POA: Diagnosis not present

## 2018-04-14 DIAGNOSIS — D509 Iron deficiency anemia, unspecified: Secondary | ICD-10-CM | POA: Diagnosis not present

## 2018-04-26 ENCOUNTER — Other Ambulatory Visit: Payer: Self-pay | Admitting: Sports Medicine

## 2018-05-07 ENCOUNTER — Encounter: Payer: Self-pay | Admitting: Sports Medicine

## 2018-05-07 ENCOUNTER — Ambulatory Visit (INDEPENDENT_AMBULATORY_CARE_PROVIDER_SITE_OTHER): Payer: Medicare HMO | Admitting: Sports Medicine

## 2018-05-07 ENCOUNTER — Other Ambulatory Visit: Payer: Self-pay

## 2018-05-07 DIAGNOSIS — M1711 Unilateral primary osteoarthritis, right knee: Secondary | ICD-10-CM

## 2018-05-07 NOTE — Progress Notes (Signed)
Subjective:    CC: Right knee pain  HPI: This is a pleasant 85 year old male with right knee osteoarthritis, for the past several weeks has had increasing pain, swelling in his right knee, his last injection was 3 months ago.  We did diagnose him with CPPD in the knee, however his renal function was not sufficiently good to use colchicine.  Pain is moderate, worsening, no mechanical symptoms, localized without radiation.  I reviewed the past medical history, family history, social history, surgical history, and allergies today and no changes were needed.  Please see the problem list section below in epic for further details.  Past Medical History: Past Medical History:  Diagnosis Date  . Bladder cancer ([place]) 10/23/2015  . CAD (coronary artery disease) status post bypass grafting 01/29/2013  . Clostridium difficile diarrhea 07/18/2016  . Essential hypertension, benign 01/29/2013  . History of Clostridioides difficile infection 07/18/2016  . History of prostate cancer 01/29/2013  . Hyperlipidemia 01/29/2013  . Primary osteoarthritis of right knee 02/09/2013   Supartz received   . Right lower lobe pneumonia ([place]) 07/03/2017  . Rotator cuff tear arthropathy of left shoulder 04/09/2013  . Skin cancer 11/15/2013   Past Surgical History: Past Surgical History:  Procedure Laterality Date  . APPENDECTOMY  05/03/2012  . CATARACT EXTRACTION Left 06/13/2011   eye  . CATARACT EXTRACTION Right 08/15/2011   eye  . HERNIA REPAIR  2009  . LUMBAR LAMINECTOMY/ DECOMPRESSION WITH MET-RX  2008  . mandibular tori  [phone]  . MAXIMUM ACCESS (MAS)POSTERIOR LUMBAR INTERBODY FUSION (PLIF) 1 LEVEL  01/12/2010  . [place]  . PROSTATECTOMY  1992  . RADICAL ORCHIECTOMY  2004  . removal of nasal polyps and sinus tissue  1997  . SHOULDER ARTHROSCOPY WITH SUBACROMIAL DECOMPRESSION, ROTATOR CUFF REPAIR AND BICEP TENDON REPAIR  1998  . thumb joint repair Left 1999  . thumb joint repair  Right 2004  . TONSILECTOMY, ADENOIDECTOMY, BILATERAL MYRINGOTOMY AND TUBES  1940  . triple by-pass heart surgery  2006  . UVULOPALATOPHARYNGOPLASTY  1996   Social History: Social History   Socioeconomic History  . Marital status: Married    Spouse name: Not on file  . Number of children: Not on file  . Years of education: Not on file  . Highest education level: Not on file  Occupational History  . Not on file  Social Needs  . Financial resource strain: Not on file  . Food insecurity:    Worry: Not on file    Inability: Not on file  . Transportation needs:    Medical: Not on file    Non-medical: Not on file  Tobacco Use  . Smoking status: Former Research scientist (life sciences)  . Smokeless tobacco: Never Used  Substance and Sexual Activity  . Alcohol use: No  . Drug use: No  . Sexual activity: Not on file  Lifestyle  . Physical activity:    Days per week: Not on file    Minutes per session: Not on file  . Stress: Not on file  Relationships  . Social connections:    Talks on phone: Not on file    Gets together: Not on file    Attends religious service: Not on file    Active member of club or organization: Not on file    Attends meetings of clubs or organizations: Not on file    Relationship status: Not on file  Other Topics Concern  . Not on file  Social History Narrative  .  Not on file   Family History: No family history on file. Allergies: Allergies  Allergen Reactions  . Aspirin Other (See Comments)    Unknown/Pt states unable to take due to stomach issues and anemia  . Metolazone Other (See Comments)    / Pt denies   Medications: See med rec.  Review of Systems: No fevers, chills, night sweats, weight loss, chest pain, or shortness of breath.   Objective:    General: Well Developed, well nourished, and in no acute distress.  Neuro: Alert and oriented x3, extra-ocular muscles intact, sensation grossly intact.  HEENT: Normocephalic, atraumatic, pupils equal round reactive to  light, neck supple, no masses, no lymphadenopathy, thyroid nonpalpable.  Skin: Warm and dry, no rashes. Cardiac: Regular rate and rhythm, no murmurs rubs or gallops, no lower extremity edema.  Respiratory: Clear to auscultation bilaterally. Not using accessory muscles, speaking in full sentences. Right knee: Knee is swollen, tender at the medial joint line, effusion with a fluid wave. ROM normal in flexion and extension and lower leg rotation. Ligaments with solid consistent endpoints including ACL, PCL, LCL, MCL. Negative Mcmurray's and provocative meniscal tests. Non painful patellar compression. Patellar and quadriceps tendons unremarkable. Hamstring and quadriceps strength is normal.  Procedure: Real-time Ultrasound Guided  aspiration/injection of the right knee Device: GE Logiq E  Verbal informed consent obtained.  Time-out conducted.  Noted no overlying erythema, induration, or other signs of local infection.  Skin prepped in a sterile fashion.  Local anesthesia: Topical Ethyl chloride.  With sterile technique and under real time ultrasound guidance:  I aspirated 40 cc of slightly cloudy, straw-colored fluid, syringe switched and 1 cc Kenalog 40, 2 cc lidocaine, 2 cc bupivacaine injected easily Completed without difficulty  Pain immediately resolved suggesting accurate placement of the medication.  Advised to call if fevers/chills, erythema, induration, drainage, or persistent bleeding.  Images permanently stored and available for review in the ultrasound unit.  Impression: Technically successful ultrasound guided injection.  Impression and Recommendations:    Primary osteoarthritis of right knee with CPPD Aspiration and injection, previous procedure was 3 months ago. Renal function was not sufficiently good to use colchicine.   ___________________________________________ Gwen Her. Dianah Field, M.D., ABFM., CAQSM. Primary Care and Sports Medicine [facility]  [facility]  Adjunct Professor of [place] [employer] [facility] of Medicine

## 2018-05-07 NOTE — Assessment & Plan Note (Signed)
Aspiration and injection, previous procedure was 3 months ago. Renal function was not sufficiently good to use colchicine.

## 2018-05-19 ENCOUNTER — Other Ambulatory Visit: Payer: Self-pay | Admitting: Sports Medicine

## 2018-05-19 DIAGNOSIS — I10 Essential (primary) hypertension: Secondary | ICD-10-CM

## 2018-06-10 DIAGNOSIS — Z79899 Other long term (current) drug therapy: Secondary | ICD-10-CM | POA: Diagnosis not present

## 2018-06-10 DIAGNOSIS — Z8546 Personal history of malignant neoplasm of prostate: Secondary | ICD-10-CM | POA: Diagnosis not present

## 2018-06-10 DIAGNOSIS — R7989 Other specified abnormal findings of blood chemistry: Secondary | ICD-10-CM | POA: Diagnosis not present

## 2018-06-10 DIAGNOSIS — C679 Malignant neoplasm of bladder, unspecified: Secondary | ICD-10-CM | POA: Diagnosis not present

## 2018-06-10 DIAGNOSIS — D509 Iron deficiency anemia, unspecified: Secondary | ICD-10-CM | POA: Diagnosis not present

## 2018-06-13 ENCOUNTER — Other Ambulatory Visit: Payer: Self-pay | Admitting: Sports Medicine

## 2018-06-13 DIAGNOSIS — I10 Essential (primary) hypertension: Secondary | ICD-10-CM

## 2018-06-15 DIAGNOSIS — D509 Iron deficiency anemia, unspecified: Secondary | ICD-10-CM | POA: Diagnosis not present

## 2018-06-15 DIAGNOSIS — C679 Malignant neoplasm of bladder, unspecified: Secondary | ICD-10-CM | POA: Diagnosis not present

## 2018-06-17 ENCOUNTER — Other Ambulatory Visit: Payer: Self-pay | Admitting: Sports Medicine

## 2018-06-19 ENCOUNTER — Encounter: Payer: Self-pay | Admitting: Sports Medicine

## 2018-06-19 ENCOUNTER — Ambulatory Visit (INDEPENDENT_AMBULATORY_CARE_PROVIDER_SITE_OTHER): Payer: Medicare HMO | Admitting: Sports Medicine

## 2018-06-19 DIAGNOSIS — M1711 Unilateral primary osteoarthritis, right knee: Secondary | ICD-10-CM

## 2018-06-19 MED ORDER — HYDROCODONE-ACETAMINOPHEN 10-325 MG PO TABS: 1.0000 | ORAL_TABLET | Freq: Two times a day (BID) | ORAL | 0 refills | Status: DC | PRN

## 2018-06-19 NOTE — Progress Notes (Signed)
Subjective:    CC: Right knee swelling  HPI: Jeffery Hart is a pleasant 85 year old male with inoperable right knee osteoarthritis, we have tried multiple treatments, he does have CPPD as well, but renal function will not tolerate colchicine.  Thus we treat him with analgesics, and frequent therapeutic arthrocentesis.  He is having recurrence of swelling, severe, persistent, localized without radiation.  I reviewed the past medical history, family history, social history, surgical history, and allergies today and no changes were needed.  Please see the problem list section below in epic for further details.  Past Medical History: Past Medical History:  Diagnosis Date   Bladder cancer ([place]) 10/23/2015   CAD (coronary artery disease) status post bypass grafting 01/29/2013   Clostridium difficile diarrhea 07/18/2016   Essential hypertension, benign 01/29/2013   History of Clostridioides difficile infection 07/18/2016   History of prostate cancer 01/29/2013   Hyperlipidemia 01/29/2013   Primary osteoarthritis of right knee 02/09/2013   Supartz received    Right lower lobe pneumonia ([place]) 07/03/2017   Rotator cuff tear arthropathy of left shoulder 04/09/2013   Skin cancer 11/15/2013   Past Surgical History: Past Surgical History:  Procedure Laterality Date   APPENDECTOMY  05/03/2012   CATARACT EXTRACTION Left 06/13/2011   eye   CATARACT EXTRACTION Right 08/15/2011   eye   HERNIA REPAIR  2009   LUMBAR LAMINECTOMY/ DECOMPRESSION WITH MET-RX  2008   mandibular tori  [phone]   MAXIMUM ACCESS (MAS)POSTERIOR LUMBAR INTERBODY FUSION (PLIF) 1 LEVEL  01/12/2010   NASAL SEPTUM SURGERY  1948, [place]   RADICAL ORCHIECTOMY  2004   removal of nasal polyps and sinus tissue  1997   SHOULDER ARTHROSCOPY WITH SUBACROMIAL DECOMPRESSION, ROTATOR CUFF REPAIR AND BICEP TENDON REPAIR  1998   thumb joint repair Left 1999   thumb joint repair Right 2004   TONSILECTOMY,  ADENOIDECTOMY, BILATERAL MYRINGOTOMY AND TUBES  1940   triple by-pass heart surgery  2006   UVULOPALATOPHARYNGOPLASTY  1996   Social History: Social History   Socioeconomic History   Marital status: Married    Spouse name: Not on file   Number of children: Not on file   Years of education: Not on file   Highest education level: Not on file  Occupational History   Not on file  Social Needs   Financial resource strain: Not on file   Food insecurity:    Worry: Not on file    Inability: Not on file   Transportation needs:    Medical: Not on file    Non-medical: Not on file  Tobacco Use   Smoking status: Former Smoker   Smokeless tobacco: Never Used  Substance and Sexual Activity   Alcohol use: No   Drug use: No   Sexual activity: Not on file  Lifestyle   Physical activity:    Days per week: Not on file    Minutes per session: Not on file   Stress: Not on file  Relationships   Social connections:    Talks on phone: Not on file    Gets together: Not on file    Attends religious service: Not on file    Active member of club or organization: Not on file    Attends meetings of clubs or organizations: Not on file    Relationship status: Not on file  Other Topics Concern   Not on file  Social History Narrative   Not on file   Family History: No family  history on file. Allergies: Allergies  Allergen Reactions   Aspirin Other (See Comments)    Unknown/Pt states unable to take due to stomach issues and anemia   Metolazone Other (See Comments)    / Pt denies   Medications: See med rec.  Review of Systems: No fevers, chills, night sweats, weight loss, chest pain, or shortness of breath.   Objective:    General: Well Developed, well nourished, and in no acute distress.  Neuro: Alert and oriented x3, extra-ocular muscles intact, sensation grossly intact.  HEENT: Normocephalic, atraumatic, pupils equal round reactive to light, neck supple, no  masses, no lymphadenopathy, thyroid nonpalpable.  Skin: Warm and dry, no rashes. Cardiac: Regular rate and rhythm, no murmurs rubs or gallops, no lower extremity edema.  Respiratory: Clear to auscultation bilaterally. Not using accessory muscles, speaking in full sentences. Right knee: Swollen, severe effusion. ROM normal in flexion and extension and lower leg rotation. Ligaments with solid consistent endpoints including ACL, PCL, LCL, MCL. Negative Mcmurray's and provocative meniscal tests. Non painful patellar compression. Patellar and quadriceps tendons unremarkable. Hamstring and quadriceps strength is normal.  Procedure: Real-time Ultrasound Guided aspiration of right knee Device: GE Logiq E  Verbal informed consent obtained.  Time-out conducted.  Noted no overlying erythema, induration, or other signs of local infection.  Skin prepped in a sterile fashion.  Local anesthesia: Topical Ethyl chloride.  With sterile technique and under real time ultrasound guidance:  Using an 18-gauge needle I aspirated 57 cc of slightly cloudy, straw-colored fluid. Completed without difficulty  Pain immediately resolved suggesting accurate placement of the medication.  Advised to call if fevers/chills, erythema, induration, drainage, or persistent bleeding.  Images permanently stored and available for review in the ultrasound unit.  Impression: Technically successful ultrasound guided injection.  Impression and Recommendations:    Primary osteoarthritis of right knee with CPPD Repeat arthrocentesis. Renal function was insufficient to use colchicine. Increasing pain medication to twice a day. Jeffery Hart is not a surgical candidate.   ___________________________________________ Gwen Her. Dianah Field, M.D., ABFM., CAQSM. Primary Care and Sports Medicine [facility]  Adjunct Professor of [employer] of Medicine

## 2018-06-19 NOTE — Assessment & Plan Note (Signed)
Repeat arthrocentesis. Renal function was insufficient to use colchicine. Increasing pain medication to twice a day. Jeffery Hart is not a surgical candidate.

## 2018-07-03 DIAGNOSIS — Z1159 Encounter for screening for other viral diseases: Secondary | ICD-10-CM | POA: Diagnosis not present

## 2018-07-03 DIAGNOSIS — Z01812 Encounter for preprocedural laboratory examination: Secondary | ICD-10-CM | POA: Diagnosis not present

## 2018-07-03 DIAGNOSIS — C679 Malignant neoplasm of bladder, unspecified: Secondary | ICD-10-CM | POA: Diagnosis not present

## 2018-07-06 DIAGNOSIS — N135 Crossing vessel and stricture of ureter without hydronephrosis: Secondary | ICD-10-CM | POA: Diagnosis not present

## 2018-07-06 DIAGNOSIS — E785 Hyperlipidemia, unspecified: Secondary | ICD-10-CM | POA: Diagnosis not present

## 2018-07-06 DIAGNOSIS — C678 Malignant neoplasm of overlapping sites of bladder: Secondary | ICD-10-CM | POA: Diagnosis not present

## 2018-07-06 DIAGNOSIS — K219 Gastro-esophageal reflux disease without esophagitis: Secondary | ICD-10-CM | POA: Diagnosis not present

## 2018-07-06 DIAGNOSIS — C679 Malignant neoplasm of bladder, unspecified: Secondary | ICD-10-CM | POA: Diagnosis not present

## 2018-07-06 DIAGNOSIS — I2510 Atherosclerotic heart disease of native coronary artery without angina pectoris: Secondary | ICD-10-CM | POA: Diagnosis not present

## 2018-07-06 DIAGNOSIS — C61 Malignant neoplasm of prostate: Secondary | ICD-10-CM | POA: Diagnosis not present

## 2018-07-06 DIAGNOSIS — Z466 Encounter for fitting and adjustment of urinary device: Secondary | ICD-10-CM | POA: Diagnosis not present

## 2018-07-06 DIAGNOSIS — Z955 Presence of coronary angioplasty implant and graft: Secondary | ICD-10-CM | POA: Diagnosis not present

## 2018-07-06 DIAGNOSIS — D649 Anemia, unspecified: Secondary | ICD-10-CM | POA: Diagnosis not present

## 2018-07-06 DIAGNOSIS — I252 Old myocardial infarction: Secondary | ICD-10-CM | POA: Diagnosis not present

## 2018-07-06 DIAGNOSIS — N1330 Unspecified hydronephrosis: Secondary | ICD-10-CM | POA: Diagnosis not present

## 2018-07-09 DIAGNOSIS — R319 Hematuria, unspecified: Secondary | ICD-10-CM | POA: Diagnosis not present

## 2018-07-09 DIAGNOSIS — C679 Malignant neoplasm of bladder, unspecified: Secondary | ICD-10-CM | POA: Diagnosis not present

## 2018-07-09 DIAGNOSIS — N135 Crossing vessel and stricture of ureter without hydronephrosis: Secondary | ICD-10-CM | POA: Diagnosis not present

## 2018-07-18 ENCOUNTER — Other Ambulatory Visit: Payer: Self-pay | Admitting: Sports Medicine

## 2018-07-27 ENCOUNTER — Telehealth: Payer: Self-pay | Admitting: Sports Medicine

## 2018-07-27 DIAGNOSIS — M1711 Unilateral primary osteoarthritis, right knee: Secondary | ICD-10-CM

## 2018-07-27 MED ORDER — HYDROCODONE-ACETAMINOPHEN 10-325 MG PO TABS: 1.0000 | ORAL_TABLET | Freq: Four times a day (QID) | ORAL | 0 refills | Status: DC | PRN

## 2018-07-27 NOTE — Telephone Encounter (Signed)
Jeffery Hart has prostate cancer, end-stage, seems to be progressing, he has an increase in pain, I am absolutely happy to refill his Norco 10, for use every 6 hours.  We can certainly go up as needed stronger medication.

## 2018-07-28 DIAGNOSIS — K5730 Diverticulosis of large intestine without perforation or abscess without bleeding: Secondary | ICD-10-CM | POA: Diagnosis not present

## 2018-07-28 DIAGNOSIS — N1330 Unspecified hydronephrosis: Secondary | ICD-10-CM | POA: Diagnosis not present

## 2018-07-28 DIAGNOSIS — C649 Malignant neoplasm of unspecified kidney, except renal pelvis: Secondary | ICD-10-CM | POA: Diagnosis not present

## 2018-07-28 DIAGNOSIS — K3189 Other diseases of stomach and duodenum: Secondary | ICD-10-CM | POA: Diagnosis not present

## 2018-07-28 DIAGNOSIS — C679 Malignant neoplasm of bladder, unspecified: Secondary | ICD-10-CM | POA: Diagnosis not present

## 2018-08-03 DIAGNOSIS — I2583 Coronary atherosclerosis due to lipid rich plaque: Secondary | ICD-10-CM | POA: Diagnosis not present

## 2018-08-03 DIAGNOSIS — N179 Acute kidney failure, unspecified: Secondary | ICD-10-CM | POA: Diagnosis not present

## 2018-08-03 DIAGNOSIS — K8020 Calculus of gallbladder without cholecystitis without obstruction: Secondary | ICD-10-CM | POA: Diagnosis not present

## 2018-08-03 DIAGNOSIS — K5730 Diverticulosis of large intestine without perforation or abscess without bleeding: Secondary | ICD-10-CM | POA: Diagnosis not present

## 2018-08-03 DIAGNOSIS — Z20828 Contact with and (suspected) exposure to other viral communicable diseases: Secondary | ICD-10-CM | POA: Diagnosis not present

## 2018-08-03 DIAGNOSIS — R1084 Generalized abdominal pain: Secondary | ICD-10-CM | POA: Diagnosis not present

## 2018-08-03 DIAGNOSIS — I130 Hypertensive heart and chronic kidney disease with heart failure and stage 1 through stage 4 chronic kidney disease, or unspecified chronic kidney disease: Secondary | ICD-10-CM | POA: Diagnosis not present

## 2018-08-03 DIAGNOSIS — C7911 Secondary malignant neoplasm of bladder: Secondary | ICD-10-CM | POA: Diagnosis not present

## 2018-08-03 DIAGNOSIS — I2510 Atherosclerotic heart disease of native coronary artery without angina pectoris: Secondary | ICD-10-CM | POA: Diagnosis not present

## 2018-08-03 DIAGNOSIS — Z743 Need for continuous supervision: Secondary | ICD-10-CM | POA: Diagnosis not present

## 2018-08-03 DIAGNOSIS — R4020 Unspecified coma: Secondary | ICD-10-CM | POA: Diagnosis not present

## 2018-08-03 DIAGNOSIS — R0602 Shortness of breath: Secondary | ICD-10-CM | POA: Diagnosis not present

## 2018-08-03 DIAGNOSIS — J9691 Respiratory failure, unspecified with hypoxia: Secondary | ICD-10-CM | POA: Diagnosis not present

## 2018-08-03 DIAGNOSIS — R279 Unspecified lack of coordination: Secondary | ICD-10-CM | POA: Diagnosis not present

## 2018-08-03 DIAGNOSIS — Z515 Encounter for palliative care: Secondary | ICD-10-CM | POA: Diagnosis not present

## 2018-08-03 DIAGNOSIS — Z7189 Other specified counseling: Secondary | ICD-10-CM | POA: Diagnosis not present

## 2018-08-03 DIAGNOSIS — R001 Bradycardia, unspecified: Secondary | ICD-10-CM | POA: Diagnosis not present

## 2018-08-03 DIAGNOSIS — N184 Chronic kidney disease, stage 4 (severe): Secondary | ICD-10-CM | POA: Diagnosis not present

## 2018-08-03 DIAGNOSIS — N136 Pyonephrosis: Secondary | ICD-10-CM | POA: Diagnosis not present

## 2018-08-03 DIAGNOSIS — Z8546 Personal history of malignant neoplasm of prostate: Secondary | ICD-10-CM | POA: Diagnosis not present

## 2018-08-03 DIAGNOSIS — J9602 Acute respiratory failure with hypercapnia: Secondary | ICD-10-CM | POA: Diagnosis not present

## 2018-08-03 DIAGNOSIS — J189 Pneumonia, unspecified organism: Secondary | ICD-10-CM | POA: Diagnosis not present

## 2018-08-03 DIAGNOSIS — J9600 Acute respiratory failure, unspecified whether with hypoxia or hypercapnia: Secondary | ICD-10-CM | POA: Diagnosis not present

## 2018-08-03 DIAGNOSIS — Z1159 Encounter for screening for other viral diseases: Secondary | ICD-10-CM | POA: Diagnosis not present

## 2018-08-03 DIAGNOSIS — I10 Essential (primary) hypertension: Secondary | ICD-10-CM | POA: Diagnosis not present

## 2018-08-03 DIAGNOSIS — R748 Abnormal levels of other serum enzymes: Secondary | ICD-10-CM | POA: Diagnosis not present

## 2018-08-03 DIAGNOSIS — J9601 Acute respiratory failure with hypoxia: Secondary | ICD-10-CM | POA: Diagnosis not present

## 2018-08-03 DIAGNOSIS — J9621 Acute and chronic respiratory failure with hypoxia: Secondary | ICD-10-CM | POA: Diagnosis not present

## 2018-08-03 DIAGNOSIS — J9811 Atelectasis: Secondary | ICD-10-CM | POA: Diagnosis not present

## 2018-08-03 DIAGNOSIS — R918 Other nonspecific abnormal finding of lung field: Secondary | ICD-10-CM | POA: Diagnosis not present

## 2018-08-03 DIAGNOSIS — R05 Cough: Secondary | ICD-10-CM | POA: Diagnosis not present

## 2018-08-03 DIAGNOSIS — J90 Pleural effusion, not elsewhere classified: Secondary | ICD-10-CM | POA: Diagnosis not present

## 2018-08-03 DIAGNOSIS — E222 Syndrome of inappropriate secretion of antidiuretic hormone: Secondary | ICD-10-CM | POA: Diagnosis not present

## 2018-08-03 DIAGNOSIS — E875 Hyperkalemia: Secondary | ICD-10-CM | POA: Diagnosis not present

## 2018-08-03 DIAGNOSIS — N189 Chronic kidney disease, unspecified: Secondary | ICD-10-CM | POA: Diagnosis not present

## 2018-08-03 DIAGNOSIS — N135 Crossing vessel and stricture of ureter without hydronephrosis: Secondary | ICD-10-CM | POA: Diagnosis not present

## 2018-08-03 DIAGNOSIS — G9341 Metabolic encephalopathy: Secondary | ICD-10-CM | POA: Diagnosis not present

## 2018-08-03 DIAGNOSIS — R0902 Hypoxemia: Secondary | ICD-10-CM | POA: Diagnosis not present

## 2018-08-03 DIAGNOSIS — K7689 Other specified diseases of liver: Secondary | ICD-10-CM | POA: Diagnosis not present

## 2018-08-03 DIAGNOSIS — I5032 Chronic diastolic (congestive) heart failure: Secondary | ICD-10-CM | POA: Diagnosis not present

## 2018-08-03 DIAGNOSIS — C679 Malignant neoplasm of bladder, unspecified: Secondary | ICD-10-CM | POA: Diagnosis not present

## 2018-08-03 DIAGNOSIS — R531 Weakness: Secondary | ICD-10-CM | POA: Diagnosis not present

## 2018-08-03 DIAGNOSIS — M25461 Effusion, right knee: Secondary | ICD-10-CM | POA: Diagnosis not present

## 2018-08-03 DIAGNOSIS — N1330 Unspecified hydronephrosis: Secondary | ICD-10-CM | POA: Diagnosis not present

## 2018-08-03 DIAGNOSIS — S0990XA Unspecified injury of head, initial encounter: Secondary | ICD-10-CM | POA: Diagnosis not present

## 2018-08-03 DIAGNOSIS — R5381 Other malaise: Secondary | ICD-10-CM | POA: Diagnosis not present

## 2018-08-03 DIAGNOSIS — D649 Anemia, unspecified: Secondary | ICD-10-CM | POA: Diagnosis not present

## 2018-08-03 DIAGNOSIS — Z466 Encounter for fitting and adjustment of urinary device: Secondary | ICD-10-CM | POA: Diagnosis not present

## 2018-08-03 MED ORDER — DOPAMINE IN D5W 1.6-5 MG/ML-% IV SOLN
.10 | INTRAVENOUS | Status: DC
Start: ? — End: 2018-08-03

## 2018-08-03 MED ORDER — GENERIC EXTERNAL MEDICATION
Status: DC
Start: ? — End: 2018-08-03

## 2018-08-03 MED ORDER — SODIUM CHLORIDE 0.9 % IV SOLN
10.00 | INTRAVENOUS | Status: DC
Start: ? — End: 2018-08-03

## 2018-08-10 ENCOUNTER — Telehealth: Payer: Self-pay | Admitting: *Deleted

## 2018-08-10 MED ORDER — AMLODIPINE BESYLATE 5 MG PO TABS
5.00 | ORAL_TABLET | ORAL | Status: DC
Start: 2018-08-12 — End: 2018-08-10

## 2018-08-10 MED ORDER — ACETAMINOPHEN 325 MG PO TABS
650.00 | ORAL_TABLET | ORAL | Status: DC
Start: ? — End: 2018-08-10

## 2018-08-10 MED ORDER — GENERIC EXTERNAL MEDICATION
0.04 | Status: DC
Start: ? — End: 2018-08-10

## 2018-08-10 MED ORDER — FENTANYL CITRATE (PF) 2500 MCG/50ML IJ SOLN
100.00 | INTRAMUSCULAR | Status: DC
Start: ? — End: 2018-08-10

## 2018-08-10 MED ORDER — CARVEDILOL 12.5 MG PO TABS
12.50 | ORAL_TABLET | ORAL | Status: DC
Start: 2018-08-11 — End: 2018-08-10

## 2018-08-10 MED ORDER — FLUCONAZOLE 200 MG PO TABS
200.00 | ORAL_TABLET | ORAL | Status: DC
Start: 2018-08-12 — End: 2018-08-10

## 2018-08-10 MED ORDER — ACETAMINOPHEN 650 MG RE SUPP
650.00 | RECTAL | Status: DC
Start: ? — End: 2018-08-10

## 2018-08-10 MED ORDER — FENTANYL CITRATE (PF) 2500 MCG/50ML IJ SOLN
25.00 | INTRAMUSCULAR | Status: DC
Start: ? — End: 2018-08-10

## 2018-08-10 MED ORDER — CYCLOBENZAPRINE HCL 10 MG PO TABS
10.00 | ORAL_TABLET | ORAL | Status: DC
Start: ? — End: 2018-08-10

## 2018-08-10 MED ORDER — FENTANYL CITRATE (PF) 2500 MCG/50ML IJ SOLN
50.00 | INTRAMUSCULAR | Status: DC
Start: ? — End: 2018-08-10

## 2018-08-10 MED ORDER — GENERIC EXTERNAL MEDICATION
Status: DC
Start: ? — End: 2018-08-10

## 2018-08-10 MED ORDER — HEPARIN SODIUM (PORCINE) 5000 UNIT/ML IJ SOLN
5000.00 | INTRAMUSCULAR | Status: DC
Start: 2018-08-10 — End: 2018-08-10

## 2018-08-10 MED ORDER — GENERIC EXTERNAL MEDICATION
0.08 | Status: DC
Start: ? — End: 2018-08-10

## 2018-08-10 MED ORDER — ACETAMINOPHEN 160 MG/5ML PO SUSP
650.00 | ORAL | Status: DC
Start: ? — End: 2018-08-10

## 2018-08-10 MED ORDER — SODIUM CHLORIDE 0.9 % IV SOLN
10.00 | INTRAVENOUS | Status: DC
Start: ? — End: 2018-08-10

## 2018-08-10 MED ORDER — HYDROCODONE-ACETAMINOPHEN 7.5-325 MG PO TABS
1.00 | ORAL_TABLET | ORAL | Status: DC
Start: ? — End: 2018-08-10

## 2018-08-10 MED ORDER — GABAPENTIN 300 MG PO CAPS
300.00 | ORAL_CAPSULE | ORAL | Status: DC
Start: 2018-08-12 — End: 2018-08-10

## 2018-08-10 MED ORDER — GENERIC EXTERNAL MEDICATION
2.00 | Status: DC
Start: 2018-08-11 — End: 2018-08-10

## 2018-08-10 NOTE — Telephone Encounter (Signed)
Debbie from [facility] left a vm letting us know that Jeffery Hart will be dc'd from the hospital with [facility]osp[facility].  She wanted to know if you were agreeable to that plan and also if you agree with the prognosis of 6 months or less.  Please advise.

## 2018-08-10 NOTE — Telephone Encounter (Signed)
I agree to both.

## 2018-08-10 NOTE — Telephone Encounter (Signed)
Debbie from [employer].

## 2018-08-12 ENCOUNTER — Telehealth: Payer: Self-pay | Admitting: Sports Medicine

## 2018-08-12 DIAGNOSIS — Z515 Encounter for palliative care: Secondary | ICD-10-CM | POA: Insufficient documentation

## 2018-08-12 MED ORDER — GENERIC EXTERNAL MEDICATION
Status: DC
Start: ? — End: 2018-08-12

## 2018-08-12 NOTE — Assessment & Plan Note (Signed)
Kerrin Champagne from [facility] called to inform us that Mr. Tillis was admitted to Hospice on 08/11/2018. She stated if you needed any updates or notes that you do not receive you can call her directly at [phone]

## 2018-08-12 NOTE — Telephone Encounter (Signed)
Dr. Quay Burow from [facility] called to inform us that Jeffery Hart was admitted to Hospice on 08/11/2018. She stated if you needed any updates or notes that you do not receive you can call her directly at [phone].   [name]

## 2018-08-13 ENCOUNTER — Other Ambulatory Visit: Payer: Self-pay

## 2018-08-13 ENCOUNTER — Telehealth: Payer: Self-pay | Admitting: *Deleted

## 2018-08-13 NOTE — Patient Outreach (Signed)
[facility]) Care Management  08/13/2018  DAYSON ABOUD [birth year] [id number]   Referral Date: 08/13/2018 Referral Source: [employer] Report Date of Admission: 08/03/2018 Diagnosis: Hyperkalemia Date of Discharge:08/11/2018 Facility: [place]: [facility]  Patient discharged with Hospice due to end stage bladder cancer.    Plan: RN CM will close case.     Jone Baseman, RN, MSN [facility] Care Management Care Management Coordinator Direct Line [phone] Toll Free: [phone]  Fax: [phone]

## 2018-08-13 NOTE — Telephone Encounter (Signed)
Verbal orders given for all of the requests.

## 2018-08-13 NOTE — Telephone Encounter (Signed)
Amanda from [place] left vm requesting verbal orders to continue sodium chloride 1g tid, dressing changes once weekly and as needed, and she needs a flush order for his bilateral neph tubes.

## 2018-08-17 NOTE — Telephone Encounter (Signed)
I called Estill Bamberg and gave her the verbal orders per Dr. Darene Lamer recommendations. She did not have any questions.

## 2018-08-19 ENCOUNTER — Other Ambulatory Visit: Payer: Self-pay | Admitting: *Deleted

## 2018-08-27 ENCOUNTER — Telehealth: Payer: Self-pay | Admitting: Family Medicine

## 2018-08-27 NOTE — Telephone Encounter (Signed)
Amber from [place] is calling and needing a verbal order for Doxepin. I see that Dr. Darene Lamer has written in the past and now home health pharmacy will be filling. Please advise.

## 2018-08-28 NOTE — Telephone Encounter (Signed)
Okay to give verbal order for doxepin 25 mg capsule at bedtime daily.

## 2018-08-28 NOTE — Telephone Encounter (Signed)
I called and and spoke with Estill Bamberg and gave the verbal Ok per Dr.M who is covering for Dr. Darene Lamer.

## 2018-08-28 NOTE — Telephone Encounter (Signed)
I called and spoke with Estill Bamberg at [phone] and gave her the ok per Dr. Jerilynn Mages who was covering for Dr. Darene Lamer.

## 2018-09-03 ENCOUNTER — Telehealth: Payer: Self-pay | Admitting: Sports Medicine

## 2018-09-03 DIAGNOSIS — G40909 Epilepsy, unspecified, not intractable, without status epilepticus: Secondary | ICD-10-CM | POA: Insufficient documentation

## 2018-09-03 MED ORDER — ONDANSETRON 8 MG PO TBDP: 8.0000 mg | ORAL_TABLET | Freq: Three times a day (TID) | ORAL | 3 refills | Status: AC | PRN

## 2018-09-03 MED ORDER — DIAZEPAM 2.5 MG RE GEL: 2.5000 mg | Freq: Once | RECTAL | 3 refills | Status: AC

## 2018-09-03 NOTE — Telephone Encounter (Signed)
No seizures in the hospital. Are you sending in the rectal diazepam?

## 2018-09-03 NOTE — Telephone Encounter (Signed)
Estill Bamberg, RN with home health called and stated patient had a seizure this morning and they want to know if they can get some Ativan on board for him and some Zofran for his nausea. KG LPN

## 2018-09-03 NOTE — Telephone Encounter (Signed)
Prior Auth obtained for MRI Brain. Auth # [id number] good from 09/03/18 to 10/03/18. Called to schedule procedure and they can only do it at [facility]  in the morning at 11am. Pt notified

## 2018-09-03 NOTE — Telephone Encounter (Signed)
If he is having seizures then rectal diazepam would be a better option, I am going to add some Zofran.  Did he have seizures in the hospital?  If not then we need to work this up ASAP.

## 2018-09-03 NOTE — Telephone Encounter (Signed)
New onset seizures needs stat labs, brain MRI.  I'm ordering the labs, have him get them done ASAP today and ordering the brain MRI too, likely [facility] but try to find any facility that can do it today and order can be changed to whichever facility is open.

## 2018-09-03 NOTE — Assessment & Plan Note (Signed)
New onset seizures, needs stat labs including CBC, CMP, prolactin levels.  I would also like a stat brain MRI with and without contrast.  This is going to be ordered at [facility] but staff needs to find a location to have this done today, order location can be changed if needed.

## 2018-09-04 ENCOUNTER — Ambulatory Visit (HOSPITAL_COMMUNITY)
Admission: RE | Admit: 2018-09-04 | Discharge: 2018-09-04 | Disposition: A | Discharge status: Home / Self Care | Payer: Medicare HMO | Source: Ambulatory Visit | Attending: Sports Medicine | Admitting: Sports Medicine

## 2018-09-04 ENCOUNTER — Ambulatory Visit (HOSPITAL_COMMUNITY): Payer: Medicare HMO

## 2018-09-04 ENCOUNTER — Other Ambulatory Visit: Payer: Self-pay

## 2018-09-04 DIAGNOSIS — G40909 Epilepsy, unspecified, not intractable, without status epilepticus: Secondary | ICD-10-CM | POA: Diagnosis present

## 2018-09-04 DIAGNOSIS — R569 Unspecified convulsions: Secondary | ICD-10-CM | POA: Diagnosis not present

## 2018-09-04 NOTE — Telephone Encounter (Signed)
Noted, thank you for working on this.

## 2018-09-04 NOTE — Telephone Encounter (Signed)
I got this set up and authorized but had to do it at [facility] at 11:00 today as patient wife was crying saying she could'nt do original time which was at 8 this morning. Wife is very upset and crying a lot due to having to figure out how she is gonna get him there. She told me that he had had seizures in the past but home health said none until came home from the hospital. Just an FYI and I hope she is able to get him there to have the scan. KG LPN

## 2018-09-05 ENCOUNTER — Other Ambulatory Visit: Payer: Self-pay | Admitting: Sports Medicine

## 2018-09-05 DIAGNOSIS — G40909 Epilepsy, unspecified, not intractable, without status epilepticus: Secondary | ICD-10-CM

## 2018-09-05 DIAGNOSIS — I10 Essential (primary) hypertension: Secondary | ICD-10-CM

## 2018-09-09 DIAGNOSIS — N1330 Unspecified hydronephrosis: Secondary | ICD-10-CM | POA: Diagnosis not present

## 2018-09-09 DIAGNOSIS — Z951 Presence of aortocoronary bypass graft: Secondary | ICD-10-CM | POA: Diagnosis not present

## 2018-09-09 DIAGNOSIS — E785 Hyperlipidemia, unspecified: Secondary | ICD-10-CM | POA: Diagnosis not present

## 2018-09-09 DIAGNOSIS — I2510 Atherosclerotic heart disease of native coronary artery without angina pectoris: Secondary | ICD-10-CM | POA: Diagnosis not present

## 2018-09-09 DIAGNOSIS — Z436 Encounter for attention to other artificial openings of urinary tract: Secondary | ICD-10-CM | POA: Diagnosis not present

## 2018-09-09 DIAGNOSIS — Z466 Encounter for fitting and adjustment of urinary device: Secondary | ICD-10-CM | POA: Diagnosis not present

## 2018-09-09 DIAGNOSIS — D509 Iron deficiency anemia, unspecified: Secondary | ICD-10-CM | POA: Diagnosis not present

## 2018-09-09 DIAGNOSIS — C61 Malignant neoplasm of prostate: Secondary | ICD-10-CM | POA: Diagnosis not present

## 2018-09-09 DIAGNOSIS — G4733 Obstructive sleep apnea (adult) (pediatric): Secondary | ICD-10-CM | POA: Diagnosis not present

## 2018-09-09 DIAGNOSIS — C679 Malignant neoplasm of bladder, unspecified: Secondary | ICD-10-CM | POA: Diagnosis not present

## 2018-09-09 DIAGNOSIS — N131 Hydronephrosis with ureteral stricture, not elsewhere classified: Secondary | ICD-10-CM | POA: Diagnosis not present

## 2018-09-09 MED ORDER — LORAZEPAM 2 MG/ML IJ SOLN: 2.0000 mg | INTRAMUSCULAR | 3 refills | Status: AC | PRN

## 2018-09-09 NOTE — Assessment & Plan Note (Signed)
New onset seizures, brain MRI shows a possible new metastatic versus primary tumor. Rectal Diastat was too expensive, switching to 2 mg of intramuscular Ativan for use at the onset of a seizure. We did discuss that oral medications were less than ideal to use in an actively seizing patient.

## 2018-09-09 NOTE — Addendum Note (Signed)
Addended by: Silverio Decamp on: 09/09/2018 01:32 PM   Modules accepted: Orders

## 2018-09-14 ENCOUNTER — Other Ambulatory Visit: Payer: Self-pay | Admitting: Sports Medicine

## 2018-09-17 ENCOUNTER — Other Ambulatory Visit: Payer: Self-pay | Admitting: Sports Medicine

## 2018-10-02 ENCOUNTER — Other Ambulatory Visit: Payer: Self-pay | Admitting: *Deleted

## 2018-10-02 MED ORDER — SODIUM CHLORIDE 1 G PO TABS: ORAL_TABLET | ORAL | 2 refills | Status: AC

## 2018-10-12 ENCOUNTER — Other Ambulatory Visit: Payer: Self-pay | Admitting: *Deleted

## 2018-10-12 MED ORDER — LISINOPRIL 40 MG PO TABS: 40.0000 mg | ORAL_TABLET | Freq: Every day | ORAL | 0 refills | Status: AC

## 2018-10-20 ENCOUNTER — Other Ambulatory Visit: Payer: Self-pay | Admitting: Sports Medicine

## 2018-10-20 DIAGNOSIS — D649 Anemia, unspecified: Secondary | ICD-10-CM

## 2018-10-20 MED ORDER — FERROUS SULFATE 325 (65 FE) MG PO TBEC: 325.0000 mg | DELAYED_RELEASE_TABLET | Freq: Three times a day (TID) | ORAL | 3 refills | Status: AC

## 2018-10-21 ENCOUNTER — Other Ambulatory Visit: Payer: Self-pay | Admitting: Sports Medicine

## 2018-10-21 MED ORDER — GABAPENTIN 300 MG PO CAPS: ORAL_CAPSULE | ORAL | 3 refills | Status: DC

## 2018-10-28 ENCOUNTER — Telehealth: Payer: Self-pay | Admitting: *Deleted

## 2018-10-28 DIAGNOSIS — M1711 Unilateral primary osteoarthritis, right knee: Secondary | ICD-10-CM

## 2018-10-28 MED ORDER — HYDROCODONE-ACETAMINOPHEN 10-325 MG PO TABS: 1.0000 | ORAL_TABLET | ORAL | 0 refills | Status: AC | PRN

## 2018-10-28 NOTE — Telephone Encounter (Signed)
I have changed to the Vicodin frequency, I think he needs to have his hospice orders renewed.

## 2018-10-28 NOTE — Telephone Encounter (Signed)
Estill Bamberg, RN from [place], left vm today wanting to know if pt's Vicodin could be increased to q4h prn instead of q6h.  She also wanted to verify that you will continue his care since his hospice qualifications are up for renewal.

## 2018-10-29 NOTE — Telephone Encounter (Signed)
Called to speak with amanda at [facility] and her VM was full and was unable to leave a message. I will try again at a later time.

## 2018-10-29 NOTE — Telephone Encounter (Signed)
Estill Bamberg from [place] notified of verbal orders.

## 2018-11-05 ENCOUNTER — Telehealth: Payer: Self-pay | Admitting: *Deleted

## 2018-11-05 NOTE — Telephone Encounter (Signed)
Jeffery Bamberg, RN from [place], left vm requesting a verbal order for Lasix.  She stated that pt has bilateral leg swelling with the right leg being worse than the left leg.  Please advise.

## 2018-11-05 NOTE — Telephone Encounter (Signed)
LMOM giving verbal orders for Furosemide 80mg  prn.

## 2018-11-05 NOTE — Telephone Encounter (Signed)
Verbal order given for furosemide 80 mg tab to use as needed daily for leg edema.

## 2018-11-10 DIAGNOSIS — T83022A Displacement of nephrostomy catheter, initial encounter: Secondary | ICD-10-CM | POA: Diagnosis not present

## 2018-11-10 DIAGNOSIS — I10 Essential (primary) hypertension: Secondary | ICD-10-CM | POA: Diagnosis not present

## 2018-11-24 ENCOUNTER — Other Ambulatory Visit: Payer: Self-pay | Admitting: Sports Medicine

## 2018-11-24 DIAGNOSIS — I10 Essential (primary) hypertension: Secondary | ICD-10-CM

## 2018-11-26 DIAGNOSIS — C679 Malignant neoplasm of bladder, unspecified: Secondary | ICD-10-CM | POA: Diagnosis not present

## 2018-11-26 DIAGNOSIS — R5383 Other fatigue: Secondary | ICD-10-CM | POA: Diagnosis not present

## 2018-11-26 DIAGNOSIS — R569 Unspecified convulsions: Secondary | ICD-10-CM | POA: Diagnosis not present

## 2018-11-26 DIAGNOSIS — R4020 Unspecified coma: Secondary | ICD-10-CM | POA: Diagnosis not present

## 2018-11-26 DIAGNOSIS — R1084 Generalized abdominal pain: Secondary | ICD-10-CM | POA: Diagnosis not present

## 2018-12-07 DIAGNOSIS — Z466 Encounter for fitting and adjustment of urinary device: Secondary | ICD-10-CM | POA: Diagnosis not present

## 2018-12-07 DIAGNOSIS — N1330 Unspecified hydronephrosis: Secondary | ICD-10-CM | POA: Diagnosis not present

## 2018-12-10 ENCOUNTER — Telehealth: Payer: Self-pay | Admitting: *Deleted

## 2018-12-10 NOTE — Telephone Encounter (Signed)
Estill Bamberg, RN with [facility], left vm requesting a VO to give pt Oxycodone 5 or 10mg  q4h prn for pain or sob.  Please advise.

## 2018-12-11 NOTE — Telephone Encounter (Signed)
Left vm giving verbal orders.  

## 2018-12-11 NOTE — Telephone Encounter (Signed)
Verbal order given  

## 2018-12-28 ENCOUNTER — Other Ambulatory Visit: Payer: Self-pay | Admitting: Sports Medicine

## 2018-12-28 ENCOUNTER — Telehealth: Payer: Self-pay | Admitting: *Deleted

## 2018-12-28 DIAGNOSIS — I10 Essential (primary) hypertension: Secondary | ICD-10-CM

## 2018-12-28 MED ORDER — AMLODIPINE BESYLATE 5 MG PO TABS: 5.0000 mg | ORAL_TABLET | Freq: Every day | ORAL | 1 refills | Status: DC

## 2018-12-28 MED ORDER — GABAPENTIN 300 MG PO CAPS: ORAL_CAPSULE | ORAL | 3 refills | Status: DC

## 2018-12-28 MED ORDER — CARVEDILOL 25 MG PO TABS: 25.0000 mg | ORAL_TABLET | Freq: Two times a day (BID) | ORAL | 0 refills | Status: DC

## 2018-12-28 NOTE — Telephone Encounter (Signed)
Verbal order given  

## 2018-12-28 NOTE — Telephone Encounter (Signed)
Estill Bamberg, RN with [facility] is asking for VO to increase pt's Oxycodone to 10-20mg  q4h prn.  She stated that he is passing more clots and does not want to see his Urologist.  Largest clot was golf ball size.

## 2018-12-29 NOTE — Telephone Encounter (Signed)
VO given to [facility].

## 2019-01-01 ENCOUNTER — Telehealth: Payer: Self-pay | Admitting: *Deleted

## 2019-01-01 DIAGNOSIS — I10 Essential (primary) hypertension: Secondary | ICD-10-CM

## 2019-01-01 MED ORDER — AMLODIPINE BESYLATE 5 MG PO TABS: 5.0000 mg | ORAL_TABLET | Freq: Every day | ORAL | 1 refills | Status: AC

## 2019-01-01 MED ORDER — GABAPENTIN 300 MG PO CAPS: ORAL_CAPSULE | ORAL | 3 refills | Status: AC

## 2019-01-01 MED ORDER — CARVEDILOL 25 MG PO TABS: 25.0000 mg | ORAL_TABLET | Freq: Two times a day (BID) | ORAL | 0 refills | Status: AC

## 2019-01-01 MED ORDER — FENTANYL 25 MCG/HR TD PT72: 1.0000 | MEDICATED_PATCH | TRANSDERMAL | 0 refills | Status: DC

## 2019-01-01 NOTE — Telephone Encounter (Signed)
Jeffery Hart with [facility] left vm requesting VO for an extended release pain med in addition to the q4h Oxy.  She stated that they have Morphine ER or Fentanyl patches.

## 2019-01-01 NOTE — Telephone Encounter (Signed)
Orders given.  

## 2019-01-01 NOTE — Telephone Encounter (Signed)
Adding fentanyl 25 mcg every 3 days.  We can uptitrate to 50 mcg if needed after a week.

## 2019-01-07 ENCOUNTER — Telehealth: Payer: Self-pay | Admitting: *Deleted

## 2019-01-07 MED ORDER — FENTANYL 12 MCG/HR TD PT72: 1.0000 | MEDICATED_PATCH | TRANSDERMAL | 0 refills | Status: AC

## 2019-01-07 NOTE — Telephone Encounter (Signed)
Definitely dropping to 12.5 mcg/h, calling a new prescription.  Discontinue the current one ASAP.

## 2019-01-07 NOTE — Telephone Encounter (Signed)
Estill Bamberg from [facility] called today to let you know that the Fentanyl patches are helping with the pain a lot but pt is having increased confusion and he c/o of some grogginess.  Estill Bamberg stated that she educated them on end of life but family is pretty adamant that the confusion is from the Fentanyl.  Estill Bamberg wants to know if you want to decrease the strength of the patches or change him to ER Morphine.  Please advise.

## 2019-01-08 NOTE — Telephone Encounter (Signed)
[facility] advised of recommendations.

## 2019-02-16 ENCOUNTER — Telehealth: Payer: Self-pay

## 2019-02-16 NOTE — Telephone Encounter (Signed)
Jeffery Hart with [facility] called requesting verbal orders to increase the pt's fentanyl patch from 25 mcg/hr to 37.5 mcg/hr due to increase in pain.

## 2019-02-16 NOTE — Telephone Encounter (Signed)
No problem, do I need to send in a prescription?

## 2019-02-17 DIAGNOSIS — Z886 Allergy status to analgesic agent status: Secondary | ICD-10-CM | POA: Diagnosis not present

## 2019-02-17 DIAGNOSIS — Z436 Encounter for attention to other artificial openings of urinary tract: Secondary | ICD-10-CM | POA: Diagnosis not present

## 2019-02-17 DIAGNOSIS — Z466 Encounter for fitting and adjustment of urinary device: Secondary | ICD-10-CM | POA: Diagnosis not present

## 2019-02-17 DIAGNOSIS — N135 Crossing vessel and stricture of ureter without hydronephrosis: Secondary | ICD-10-CM | POA: Diagnosis not present

## 2019-02-17 DIAGNOSIS — Z8546 Personal history of malignant neoplasm of prostate: Secondary | ICD-10-CM | POA: Diagnosis not present

## 2019-02-17 DIAGNOSIS — I110 Hypertensive heart disease with heart failure: Secondary | ICD-10-CM | POA: Diagnosis not present

## 2019-02-17 DIAGNOSIS — E785 Hyperlipidemia, unspecified: Secondary | ICD-10-CM | POA: Diagnosis not present

## 2019-02-17 DIAGNOSIS — G4733 Obstructive sleep apnea (adult) (pediatric): Secondary | ICD-10-CM | POA: Diagnosis not present

## 2019-02-17 DIAGNOSIS — I5032 Chronic diastolic (congestive) heart failure: Secondary | ICD-10-CM | POA: Diagnosis not present

## 2019-02-17 DIAGNOSIS — I2510 Atherosclerotic heart disease of native coronary artery without angina pectoris: Secondary | ICD-10-CM | POA: Diagnosis not present

## 2019-02-17 NOTE — Telephone Encounter (Signed)
Estill Bamberg with [facility] aware of approval to increase dose to fentanyl  37.5 mcg/hr. She states that a new Rx does not need to be sent in to the pharmacy.

## 2019-02-25 ENCOUNTER — Other Ambulatory Visit: Payer: Self-pay

## 2019-02-25 ENCOUNTER — Ambulatory Visit (INDEPENDENT_AMBULATORY_CARE_PROVIDER_SITE_OTHER): Payer: Medicare Other | Admitting: Sports Medicine

## 2019-02-25 DIAGNOSIS — M1711 Unilateral primary osteoarthritis, right knee: Secondary | ICD-10-CM

## 2019-02-25 NOTE — Progress Notes (Signed)
    Procedures performed today:    Procedure: Real-time Ultrasound Guided  aspiration/injection of right knee Device: Samsung HS60  Verbal informed consent obtained.  Time-out conducted.  Noted no overlying erythema, induration, or other signs of local infection.  Skin prepped in a sterile fashion.  Local anesthesia: Topical Ethyl chloride.  With sterile technique and under real time ultrasound guidance:  Using a 18-gauge needle aspirated approximately 17 mL of clear, straw-colored fluid, syringe switched and 1 cc Kenalog 40, 2 cc lidocaine, 2 cc bupivacaine injected easily Completed without difficulty  Pain immediately resolved suggesting accurate placement of the medication.  Advised to call if fevers/chills, erythema, induration, drainage, or persistent bleeding.  Images permanently stored and available for review in the ultrasound unit.  Impression: Technically successful ultrasound guided injection.  Independent interpretation of tests performed by another provider:   None.  Impression and Recommendations:    Primary osteoarthritis of right knee with CPPD Jeffery Hart has right knee osteoarthritis with calcium pyrophosphate deposition disease. He is having a worsening of pain and swelling, his last aspiration was approximately 8 months ago. We repeated his aspiration and injection today. He is not a surgical candidate. Of note his fentanyl with as needed oxycodone is working well, he has end-stage cancer related pain.    ___________________________________________ Gwen Her. Dianah Field, M.D., ABFM., CAQSM. Primary Care and [place] Instructor of [employer] of Medicine

## 2019-02-25 NOTE — Assessment & Plan Note (Signed)
Jeffery Hart has right knee osteoarthritis with calcium pyrophosphate deposition disease. He is having a worsening of pain and swelling, his last aspiration was approximately 8 months ago. We repeated his aspiration and injection today. He is not a surgical candidate. Of note his fentanyl with as needed oxycodone is working well, he has end-stage cancer related pain.

## 2019-03-02 ENCOUNTER — Telehealth: Payer: Self-pay | Admitting: *Deleted

## 2019-03-02 NOTE — Telephone Encounter (Signed)
Verbal order given  

## 2019-03-02 NOTE — Telephone Encounter (Signed)
Estill Bamberg, RN with [facility] is requesting verbal orders to increase pt's Fentanyl patch to 50.

## 2019-03-03 NOTE — Telephone Encounter (Signed)
Verbal orders given to [facility] with [facility].

## 2019-03-19 ENCOUNTER — Other Ambulatory Visit: Payer: Self-pay | Admitting: *Deleted

## 2019-03-19 DIAGNOSIS — I10 Essential (primary) hypertension: Secondary | ICD-10-CM

## 2019-03-23 ENCOUNTER — Telehealth: Payer: Self-pay | Admitting: *Deleted

## 2019-03-23 NOTE — Telephone Encounter (Signed)
Estill Bamberg, RN with [facility], left vm stating that she was cleaning his right nephrostomy site and it seems to be infected.  She said there's purulent discharge coming from the site.  She wants to know if you want to send in a broad abx and if you want them to do anything else on their end with this.  I assume that refers to wound culture, etc.  Please advise.

## 2019-03-23 NOTE — Telephone Encounter (Signed)
We absolutely need wound cultures and sensitivities, once this is obtained I will send in the antibiotics, let me know the stuff ASAP.

## 2019-03-23 NOTE — Telephone Encounter (Signed)
Spoke with Estill Bamberg and she will get a wound swab first thing in the morning.

## 2019-03-29 ENCOUNTER — Telehealth: Payer: Self-pay

## 2019-03-29 NOTE — Telephone Encounter (Signed)
Because this is an infection at a surgical site, these results need to go to his urologist.  Please call the home health agency and have them forward the information to Dr. Ihor Dow.  This is a surgical complication and it needs to be managed by the surgeon/urologist.

## 2019-03-29 NOTE — Telephone Encounter (Signed)
This encounter was created in error - please disregard.

## 2019-03-29 NOTE — Telephone Encounter (Signed)
Estill Bamberg, RN with [facility] called and stated that they pt's wound culture came back positive for MRSA. She will be faxing over a copy of the results now.   Her call back number is [phone]

## 2019-03-29 NOTE — Telephone Encounter (Signed)
Spoke with Estill Bamberg and informed her of Dr. Mcneil Sober instructions regarding who is managing this situation.

## 2019-03-29 NOTE — Telephone Encounter (Signed)
Estill Bamberg, RN with [facility] called and stated that they pt's wound culture came back positive for MRSA. She will be faxing over a copy of the results now.   Her call back number is [phone]

## 2019-03-30 ENCOUNTER — Telehealth: Payer: Self-pay | Admitting: *Deleted

## 2019-03-30 DIAGNOSIS — C679 Malignant neoplasm of bladder, unspecified: Secondary | ICD-10-CM

## 2019-03-31 ENCOUNTER — Telehealth: Payer: Self-pay | Admitting: Sports Medicine

## 2019-03-31 NOTE — Telephone Encounter (Addendum)
Death certificate signed today, a copy will be scanned into the chart and sent to the funeral home.  Floral bouquet sent to surviving family members.

## 2019-04-29 NOTE — Telephone Encounter (Signed)
Pt's son Lennette Bihari has called several times and left several messages wanting to speak to you.  He's wanting to initiate 24 hr care for jim, alice, and randy.

## 2019-04-29 NOTE — Telephone Encounter (Signed)
Jeffery Hart is a debilitated 86 year old male, his wife has been sick and has been in the hospital with Covid pneumonia, his son has spastic quadriplegic cerebral palsy. They will need significant increased level of care. Medicare does not provide for 24-hour home coverage, and I do not know that we can get them into a skilled nursing facility, ultimately they desire to stay at home though they do need to understand that at times home care is insufficiently safe. I would like to source a Education officer, museum on the case to try to find a way to get them at least 12-hour care and help at home.  I do think that Jeneen Rinks and Louie Casa would however qualify for skilled nursing level care which would be covered by Medicare.

## 2019-04-29 DEATH — deceased

## 2019-09-19 IMAGING — MR MRI HEAD WITHOUT CONTRAST
9 of 10 series · 37 of 48 positions shown · non-contrast
Comparison: No pertinent prior studies available for comparison.

CLINICAL DATA: Seizure. Additional history: New onset seizures

Please note contrast was not administered due to the patient's
creatinine/GFR on day of exam.
EXAM:
MRI HEAD WITHOUT CONTRAST
TECHNIQUE: Multiplanar, multiecho pulse sequences of the brain and surrounding
structures were obtained without intravenous contrast.

[Series 3: DWI · axial · 3.0mm · 1.09mm/px · z∈[-22,+114]mm · 11 of 94 slices shown (1 of 4)]
[im 1/94]
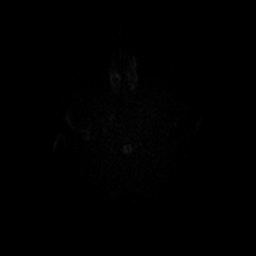
[im 10/94]
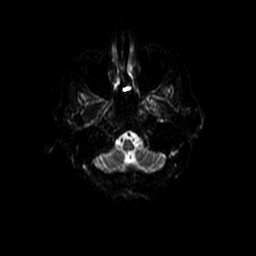
[im 19/94]
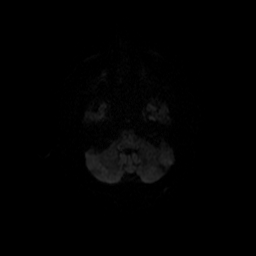
[im 28/94]
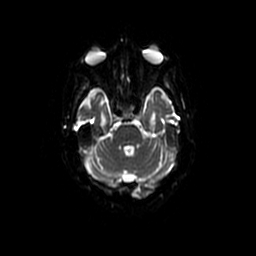
[im 38/94]
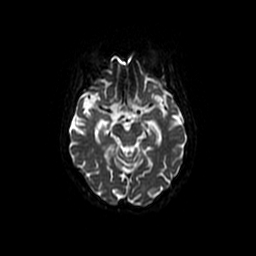
[im 47/94]
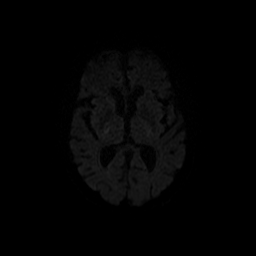
[im 56/94]
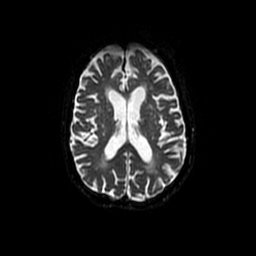
[im 66/94]
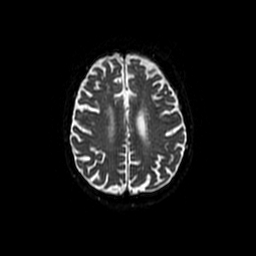
[im 75/94]
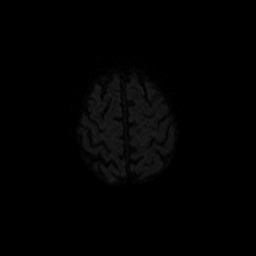
[im 84/94]
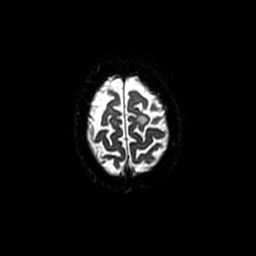
[im 94/94]
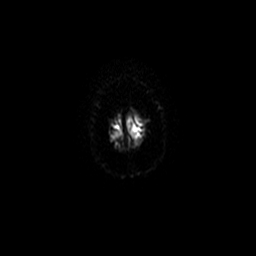

[Series 4: DWI · coronal · 5.0mm · 1.09mm/px · 7 of 72 slices shown (2 of 4)]
[im 1/72]
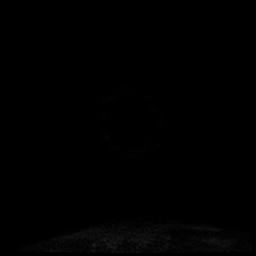
[im 12/72]
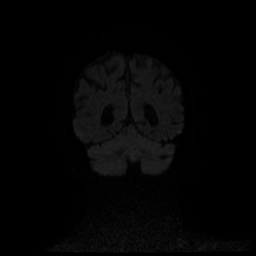
[im 24/72]
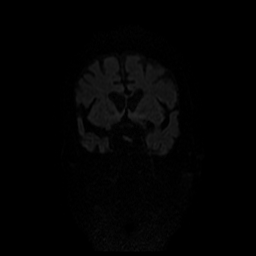
[im 36/72]
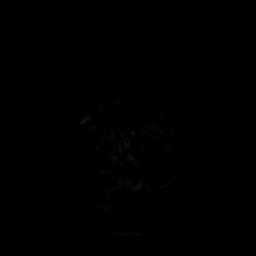
[im 48/72]
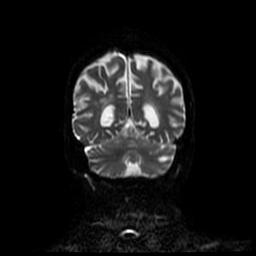
[im 60/72]
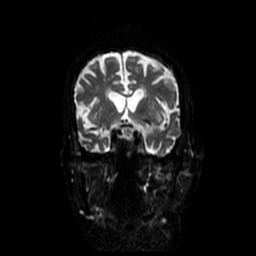
[im 72/72]
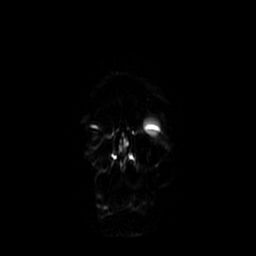

[Series 5: T1 · sagittal · 5.0mm · 0.47mm/px · 2 of 23 slices shown (1 of 2)]
[im 1/23]
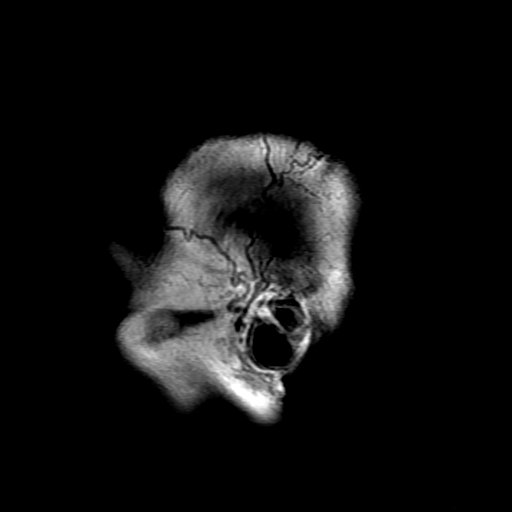
[im 23/23]
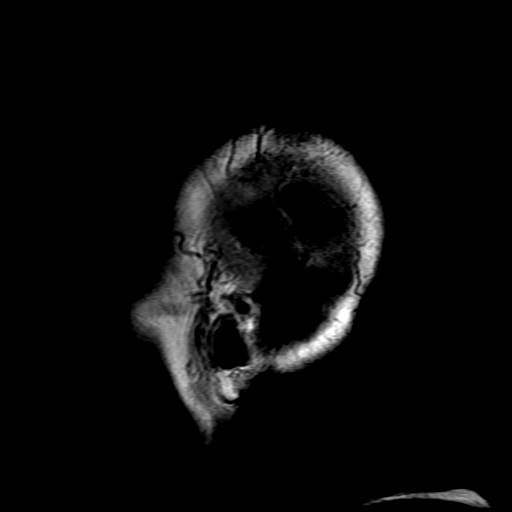

[Series 6: T2 · axial · 5.0mm · 0.43mm/px · z∈[-26,+110]mm · 2 of 24 slices shown (1 of 2)]
[im 1/24]
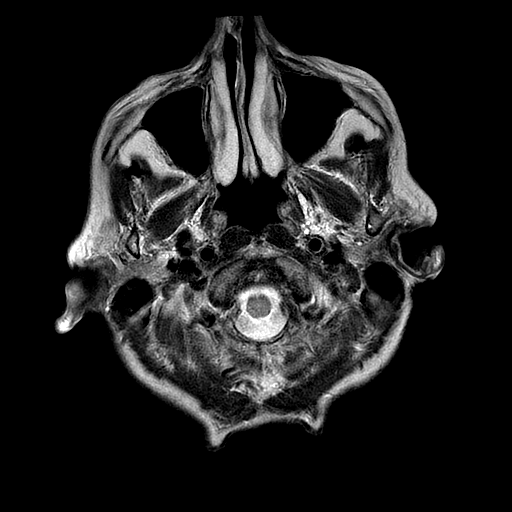
[im 24/24]
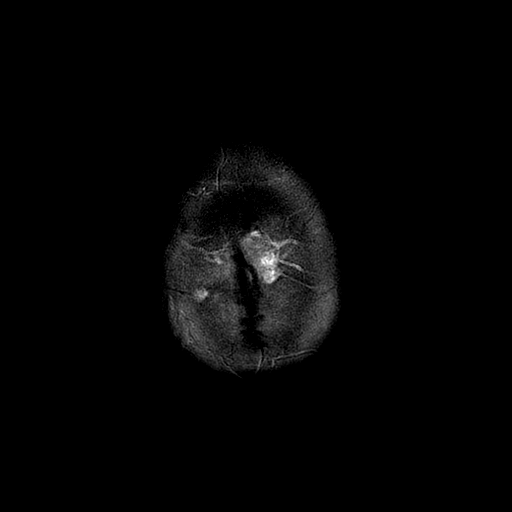

[Series 7: FLAIR · axial · 5.0mm · 0.43mm/px · z∈[-26,+110]mm · 2 of 24 slices shown]
[im 1/24]
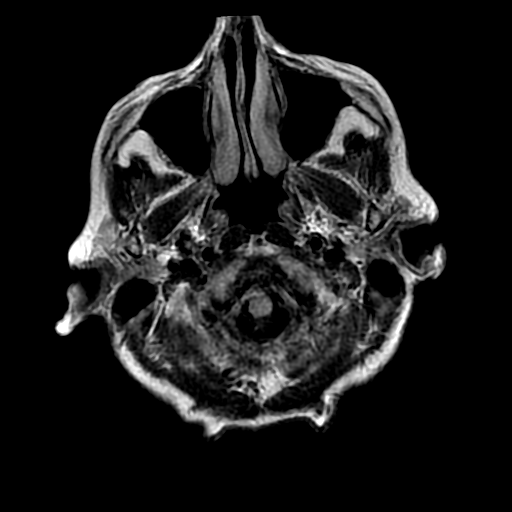
[im 24/24]
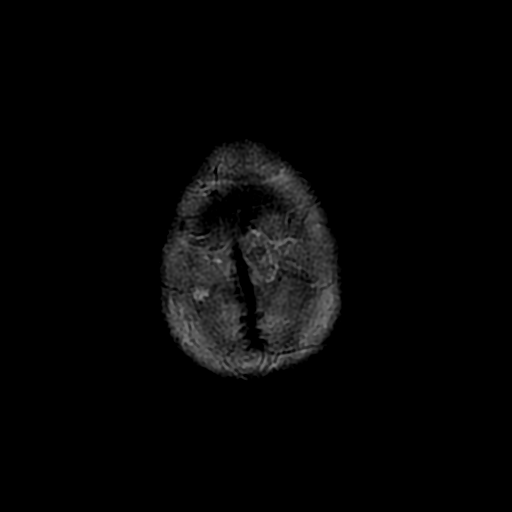

[Series 9: T1 · axial · 3.0mm · 0.47mm/px · 1 of 96 slices shown (2 of 2)]
[im 1/96]
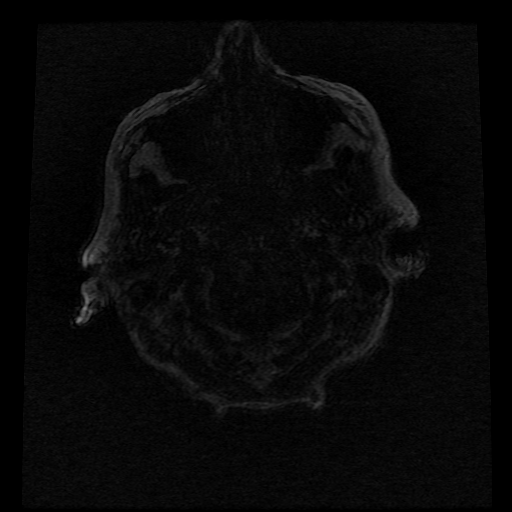

[Series 10: T2 · coronal · 5.0mm · 0.43mm/px · 3 of 30 slices shown (2 of 2)]
[im 1/30]
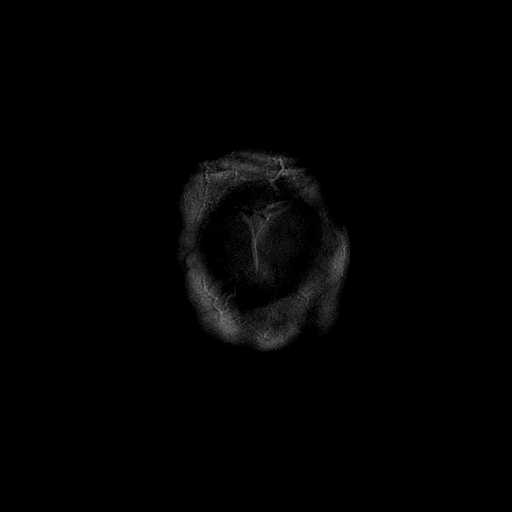
[im 15/30]
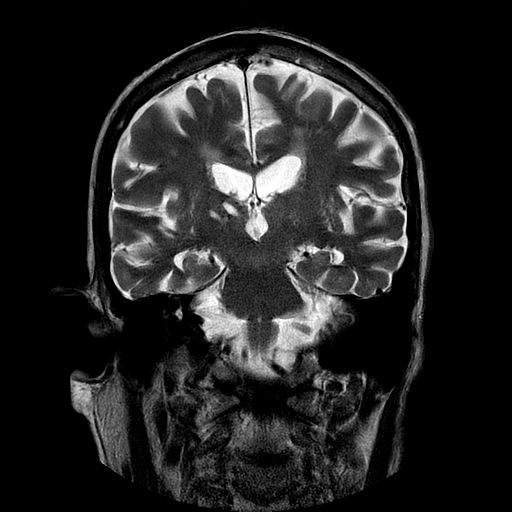
[im 30/30]
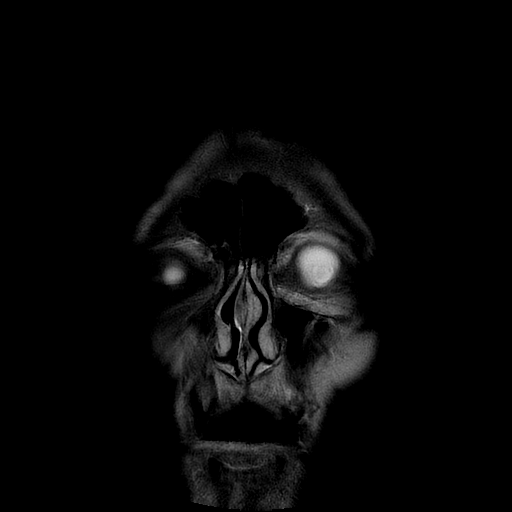

[Series 300: DWI · axial · 3.0mm · 1.09mm/px · z∈[-22,+114]mm · 5 of 47 slices shown (3 of 4)]
[im 1/47]
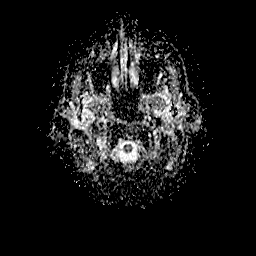
[im 12/47]
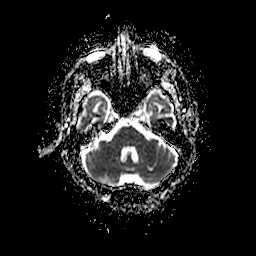
[im 24/47]
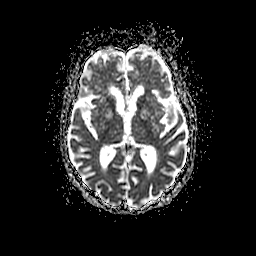
[im 35/47]
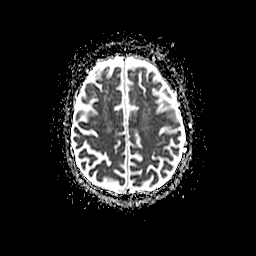
[im 47/47]
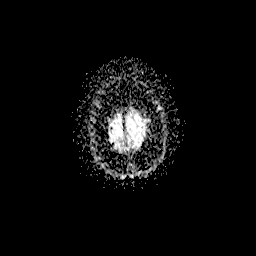

[Series 400: DWI · coronal · 5.0mm · 1.09mm/px · 4 of 36 slices shown (4 of 4)]
[im 1/36]
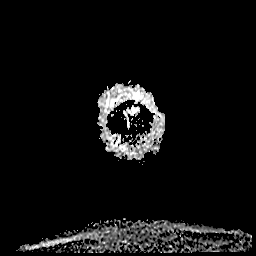
[im 12/36]
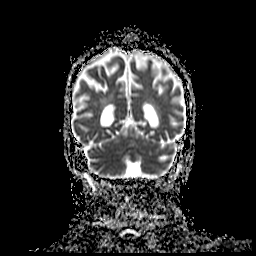
[im 24/36]
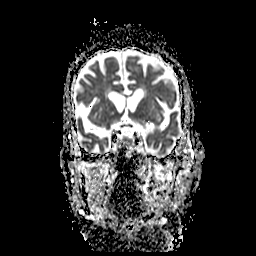
[im 36/36]
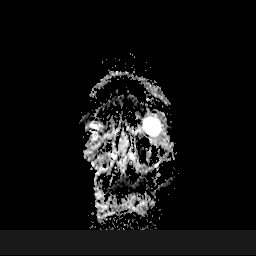

[37 of 48 positions shown; findings below may reference images not displayed]

FINDINGS: Brain: Multiple sequences are motion degraded. No restricted
diffusion is demonstrated to suggest acute or recent subacute
infarction. No evidence of intracranial hemorrhage. No midline shift
or extra-axial collection. Moderate scattered and confluent T2/FLAIR
hyperintensity within the cerebral white matter is nonspecific, but
consistent with chronic small vessel ischemic disease. Chronic
lacunar infarcts within the right thalamus and bilateral cerebellar
hemispheres. Within the posterior aspect of the left superior
frontal gyrus there is a 1.0 x 1.2 x 1.3 cm T2 hyperintense lesion
without associated volume loss. The lesion is centered within the
subcortical white matter (series 6, image 21). There is an adjacent
0.3 cm cortical cystic focus (series 6, image 22). Moderate
generalized cerebral atrophy. Please note the patient was unable to
tolerate the complete examination and dedicated coronal T2 and FLAIR
imaging oriented perpendicular to the long axis of the hippocampi
could not be performed.

Vascular: Flow voids maintained within the proximal large vessels.
Dolichoectasia of the intracranial arteries.

Skull and upper cervical spine: Normal marrow signal. Ligamentous
hypertrophy and/or pannus formation at the C1-C2 level with
resultant mild narrowing of the upper cervical spinal canal.

Sinuses/Orbits: The imaged globes and orbits demonstrate no acute
abnormality. Bilateral lens replacements. Mild ethmoid sinus mucosal
thickening. Small left mastoid effusion.

These results were called by telephone at the time of interpretation
on 09/04/2018 at [DATE] to Dr. ELVAZ SONO , who verbally
acknowledged these results.
IMPRESSION: - Contrast could not be administered due to patient's creatinine/GFR
on day of exam. Prematurely terminated and motion degraded exam as
described.

- 1.3 cm T2 hyperintense lesion centered within the subcortical
white matter of the left superior frontal gyrus. There is no
associated volume loss, and there is an adjacent 0.3 cm cortical
cystic focus. Findings are suspicious for possible primary neoplasm.

- Moderate generalized atrophy and chronic small vessel ischemic
disease. Chronic lacunar infarcts within the right thalamus and
bilateral cerebellar hemispheres.
